# Patient Record
Sex: Male | Born: 1950 | Race: Black or African American | Hispanic: No | Marital: Married | Smoking: Never smoker
Health system: Southern US, Community
[De-identification: ages and names within clinical notes are randomized; demographics above are authoritative.]

## PROBLEM LIST (undated history)

## (undated) DIAGNOSIS — Z8614 Personal history of Methicillin resistant Staphylococcus aureus infection: Secondary | ICD-10-CM

## (undated) DIAGNOSIS — I10 Essential (primary) hypertension: Secondary | ICD-10-CM

## (undated) DIAGNOSIS — Z9889 Other specified postprocedural states: Secondary | ICD-10-CM

## (undated) DIAGNOSIS — I499 Cardiac arrhythmia, unspecified: Secondary | ICD-10-CM

## (undated) DIAGNOSIS — K429 Umbilical hernia without obstruction or gangrene: Secondary | ICD-10-CM

## (undated) DIAGNOSIS — M1990 Unspecified osteoarthritis, unspecified site: Secondary | ICD-10-CM

## (undated) DIAGNOSIS — Z8719 Personal history of other diseases of the digestive system: Secondary | ICD-10-CM

## (undated) HISTORY — DX: Essential (primary) hypertension: I10

## (undated) HISTORY — DX: Other specified postprocedural states: Z98.890

## (undated) HISTORY — PX: HERNIA REPAIR: SHX51

## (undated) HISTORY — PX: COLONOSCOPY: SHX174

---

## 1999-08-31 ENCOUNTER — Inpatient Hospital Stay (HOSPITAL_COMMUNITY): Admission: RE | Admit: 1999-08-31 | Discharge: 1999-09-03 | Payer: Self-pay | Admitting: Gastroenterology

## 1999-11-06 ENCOUNTER — Ambulatory Visit (HOSPITAL_COMMUNITY): Admission: RE | Admit: 1999-11-06 | Discharge: 1999-11-06 | Payer: Self-pay | Admitting: Gastroenterology

## 2001-02-04 ENCOUNTER — Inpatient Hospital Stay (HOSPITAL_COMMUNITY): Admission: EM | Admit: 2001-02-04 | Discharge: 2001-02-07 | Payer: Self-pay | Admitting: Orthopedic Surgery

## 2005-01-01 ENCOUNTER — Encounter (INDEPENDENT_AMBULATORY_CARE_PROVIDER_SITE_OTHER): Payer: Self-pay | Admitting: Specialist

## 2005-01-01 ENCOUNTER — Inpatient Hospital Stay (HOSPITAL_COMMUNITY): Admission: AD | Admit: 2005-01-01 | Discharge: 2005-01-05 | Payer: Self-pay | Admitting: Orthopedic Surgery

## 2005-09-03 HISTORY — PX: FOOT SURGERY: SHX648

## 2007-04-07 ENCOUNTER — Emergency Department (HOSPITAL_COMMUNITY): Admission: EM | Admit: 2007-04-07 | Discharge: 2007-04-07 | Payer: Self-pay | Admitting: Emergency Medicine

## 2007-04-07 HISTORY — PX: AORTIC ROOT REPLACEMENT: SHX1178

## 2007-09-12 ENCOUNTER — Ambulatory Visit (HOSPITAL_BASED_OUTPATIENT_CLINIC_OR_DEPARTMENT_OTHER): Admission: RE | Admit: 2007-09-12 | Discharge: 2007-09-12 | Payer: Self-pay | Admitting: Orthopedic Surgery

## 2011-03-05 ENCOUNTER — Other Ambulatory Visit: Payer: Self-pay | Admitting: Gastroenterology

## 2011-03-13 NOTE — Op Note (Signed)
NAMEELBERT, SPICKLER               ACCOUNT NO.:  [id number]   MEDICAL RECORD NO.:  [id number]          PATIENT TYPE:  AMB   LOCATION:  DSC                          FACILITY:  [facility]   PHYSICIAN:  Katy Fitch. Sypher, M.D. DATE OF BIRTH:  1950-12-15   DATE OF PROCEDURE:  09/12/2007  DATE OF DISCHARGE:                               OPERATIVE REPORT   PREOPERATIVE DIAGNOSIS:  1. 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  2. 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   POSTOPERATIVE DIAGNOSIS:  1. 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  2. 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   OPERATION:  1. Release of right transverse carpal ligament.  2. Through a separate incision, decompression of right ulnar nerve at      cubital tunnel with resection of the anconeus epitrochlear muscle.   SURGEON:  Katy Fitch. Sypher, M.D.   ASSISTANT:  Marveen Reeks. Dasnoit, P.A.-C.   ANESTHESIA:  General by LMA.   SUPERVISING ANESTHESIOLOGIST:  Janetta Hora. Gelene Mink, M.D.   INDICATIONS:  Remon Quinto is a 60 year old man referred by Dr. 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  Preoperative examination  suggested weakness of his ulnar innervated intrinsics and loss of  sensibility in the median and ulnar nerves.  Our concern was that he may  have had a brachial plexus stretch injury with the sternal splint for  his aortic reconstruction.  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  After informed consent, Mr. Sole is  brought to the operating room at this time.   PROCEDURE:  Raye Slyter is brought to the operating room and placed in  the supine position upon the operating  table.  Following the induction  of general anesthesia by LMA technique, the right arm was prepped with  Betadine soap solution and sterilely draped.  Following exsanguination  of the right arm with an Esmarch bandage, an arterial tourniquet on the  proximal brachium was inflated to 220 mmHg.   The procedure commenced with a short incision in the line of the ring  finger and the palm.  The subcutaneous tissues were carefully divided to  the palmar fascia.  This was split longitudinally to the common sensory  branch of median nerve.  These were followed back to the transverse  carpal ligament which was gently isolated from the median nerve with a  Insurance risk surveyor.  The transverse carpal ligament was released along  its ulnar border extending into the distal forearm.  This widely opened  the carpal canal.  No masses or other predicaments are noted.  Bleeding  points along margin of the loose ligament were electrocauterized with  bipolar current followed by repair of the skin with intradermal 3-0  Prolene suture.   Attention was then directed to the medial elbow.  A short incision was  fashioned directly over  the path of the ulnar nerve.  The subcutaneous  tissues were carefully divided revealing a rather sizable posterior  branch of the medial and brachiocutaneous nerve.  This was gently  dissected and protected.  The ulnar nerve was identified by palpation  posterior to the epicondyle.  There was an anconeus epitrochlear muscle  completely covering the nerve at the epicondyle.  The muscle was  released from the medial epicondyle and the fascia at the heads of the  flexor carpi ulnaris released over a distance of 6 cm.  The brachial  fascia was released proximally 6 cm.  The anconeus epitrochlear muscle  was released and fibrous bands deep to the flexor carpi ulnaris  released.  The nerve was inspected through a range of motion of 0 to 140  degrees elbow flexion.  The nerve was stable  in the cubital groove.  There was no tendency towards subluxation.   Bleeding points were electrocauterized with bipolar current followed by  repair of the skin with subdermal suture of 4-0 Vicryl and intradermal 3-  0 Prolene.  There were no apparent complications.  Mr. Thurman tolerated  the surgery and anesthesia well.  He was transferred to the recovery  room with stable vital signs.  A Tegaderm dressings applied to the elbow  with sterile gauze followed by an Ace wrap.  The hand was protected with  a sterile gauze dressing with a volar plaster splint maintaining the  wrist in 5 degrees of dorsiflexion.      Katy Fitch Sypher, M.D.  Electronically Signed     RVS/MEDQ  D:  09/12/2007  T:  09/13/2007  Job:  [id number]   cc:   Georgann Housekeeper, MD

## 2011-03-16 NOTE — Op Note (Signed)
Michael Moore, Michael Moore NO.:  [id number]   MEDICAL RECORD NO.:  [id number]          PATIENT TYPE:  OIB   LOCATION:  2866                         FACILITY:  [facility]   PHYSICIAN:  Nadara Mustard, MD     DATE OF BIRTH:  1951/08/02   DATE OF PROCEDURE:  01/01/2005  DATE OF DISCHARGE:                                 OPERATIVE REPORT   PREOPERATIVE DIAGNOSIS:  Abscess left foot with ischemic changes of the  fourth toe.   POSTOPERATIVE DIAGNOSIS:  Abscess left foot with ischemic changes of the  fourth toe.   PROCEDURE:  1.  Irrigation debridement, left foot.  2.  Fourth ray amputation left foot.   SURGEON.:  Nadara Mustard, MD   ANESTHESIA:  General.   ESTIMATED BLOOD LOSS:  Minimal.   ANTIBIOTICS:  1 gram of Kefzol.   TOURNIQUET TIME:  None.   CULTURES OBTAINED:  Times 2.   DISPOSITION:  To PACU in stable condition. The toe metatarsal sent to  pathology.   INDICATIONS FOR PROCEDURE:  The patient is a 60 year old gentleman with a  one-week history of a cellulitis and abscess of his left foot. Patient has  failed conservative care and presents at this time for urgent surgical  intervention. The risks and benefits were discussed including infection,  neurovascular injury, nonhealing of the wound, persistent infection, need  for further amputation. The patient states he understands and wish to  proceed at this time.   DESCRIPTION OF PROCEDURE:  The patient was brought to OR room 4 and  underwent general anesthetic. After adequate level of anesthesia obtained,  the patient's left lower extremity was prepped using DuraPrep and draped  into a sterile field. A dorsal incision was made over the fourth ray. A  large purulent abscess was encountered. Aerobic and anaerobic cultures were  obtained. The wound was debrided. The wound edges were debrided back to  viable healthy wound edges. The patient had a complete necrosis of all the  tissue surrounding the fourth toe  and the fourth toe and metatarsal were  amputated in one block of a ray. The wound was irrigated with pulse lavage x  3 liters. Hemostasis was obtained with electrocautery. The patient had good  bleeding wound edges. No evidence of any purulent or necrotic tissue left  within the wound. A one-quarter inch a Penrose drain was placed deep to the  wound and the plantar and dorsal wounds were closed without any tension on  the skin with a far-near-near-far suture with  3-0 nylon. The wound was covered Adaptic orthopedic sponges, sterile Webril  and a Coban dressing. The patient was extubated, taken to PACU in stable  condition, plan for admission, IV vancomycin and then discharge to home once  the cultures were finalized.      MVD/MEDQ  D:  01/01/2005  T:  01/02/2005  Job:  [id number]

## 2011-03-16 NOTE — H&P (Signed)
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 The patient states that his foot rubbed the  top of the shoe, developed a blister, and was seen by his primary care  physician and started on Keflex.  The patient is seen today in referral with  cellulitis of the entire dorsum of the left foot. He failed conservative  treatment with p.o. antibiotics.   ALLERGIES:  No known drug allergies.   MEDICATIONS:  Keflex 500 mg p.o. t.i.d.   PAST MEDICAL HISTORY:  The patient is healthy. He is status post inguinal  herniorrhaphy approximately 20 years ago.   SOCIAL HISTORY:  He works as a Museum/gallery curator.   FAMILY HISTORY:  Noncontributory.   REVIEW OF SYSTEMS:  Negative.   PHYSICAL EXAMINATION:  VITAL SIGNS: Temperature 98.3, pulse 80, respiratory  rate 16, blood pressure 132/90.  GENERAL: He is in no acute distress.  LUNGS: Clear to auscultation.  CARDIOVASCULAR: Regular rate and rhythm.  NECK: Supple with no bruits.  EXTREMITIES: Examination of the left lower extremity reveals cellulitis of  the entire dorsum of the left foot. He has ischemic changes of the 4th toe  with a draining purulent abscess from the 4th toe. He does have a palpable  dorsalis pulse. Radiographs show no bony abnormalities.   ASSESSMENT:  Left foot abscess cellulitis with ischemic changes of the 4th  toe.   PLAN:  The patient is scheduled for irrigation and debridement of the left  foot at this time. The risks and benefits were  discussed including  persistent infection, neurovascular injury, nonhealing of the wound,  potential need for amputation of the 4th toe and metatarsal. The patient  states he understands and wishes to proceed at this time.      MVD/MEDQ  D:  01/01/2005  T:  01/01/2005  Job:  [id number]

## 2011-03-16 NOTE — Discharge Summary (Signed)
Michael Moore, Michael Moore NO.:  [id number]   MEDICAL RECORD NO.:  [id number]          PATIENT TYPE:  INP   LOCATION:  5005                         FACILITY:  [facility]   PHYSICIAN:  Nadara Mustard, MD     DATE OF BIRTH:  1951/01/19   DATE OF ADMISSION:  01/01/2005  DATE OF DISCHARGE:  01/05/2005                                 DISCHARGE SUMMARY   DIAGNOSIS:  Abscess, left foot.   PROCEDURE:  Irrigation debridement of  left foot with a fourth ray  amputation.   CONDITION:  The patient was discharged to home in stable condition.   FOLLOWUP:  Follow-up in the office in two weeks.   DISCHARGE MEDICATIONS:  Prescription for Levaquin and Vicodin was given.   HISTORY OF PRESENT ILLNESS:  The patient is a 60 year old gentleman with  abscess of the left foot, including a fourth ray. The patient has failed  conservative care with p.o. antibiotics and wound debridement, and pressure  unloading.  Presents this time for a fourth ray amputation. The patient  underwent fourth ray amputation on January 01, 2005.  He received Kefzol for  infection prophylaxis. Cultures were obtained x2. Postoperatively, cultures  were positive for gram-positive cocci, and the patient was kept on Kefzol  until the final cultures results were available. The patient received  physical therapy with touchdown weightbearing on the left.   On January 04, 2005 cultures were positive for Staphylococcus aureus; however,  sensitivities were pending. The patient was changed to vancomycin after the  cultures were positive. The lab was contacted and cultures were positive for  MRSA. Cultures were sensitive to Levaquin, and the patient was switched from  IV vancomycin to p.o. Levaquin.   DISPOSITION:  Discharged to home in stable condition on January 05, 2005 with  follow-up in the office in two weeks.      MVD/MEDQ  D:  03/07/2005  T:  03/07/2005  Job:  [id number]

## 2011-03-16 NOTE — H&P (Signed)
[facility]  Patient:    Michael Moore, Michael Moore                        MRN: [id number] Adm. Date:  02/04/01 Attending:  Georges Lynch. Darrelyn Hillock, M.D. Dictator:   Della Goo, P.A.                         History and Physical  CHIEF COMPLAINT:  Right knee pain and swelling.  HISTORY OF PRESENT ILLNESS:  Michael Moore is a 60 year old black male who presented to Dr. Joellyn Moore office today with 3-4 days of right knee pain and swelling. He noted swelling over the weekend which has increased over the past few days. He does not recall having an injury to the knee, however, feels that he may have bumped it 2-3 days weeks ago. He had no abrasions or cuts to the knee. He has had a low grade fever and over the past couple of days has had significant swelling causing him to limp. At this point he is in considerable discomfort. In the office he was noted to have erythema and edema about the right knee in the prepatellar area. Dr. Darrelyn Hillock performed aspiration of this area in the office and obtained slightly cloudy aspirate which was sent for culture and sensitivity. Due to his ongoing symptoms and findings from aspirate, it was felt that he would require IV antibiotic therapy and is being admitted to the hospital for IV therapy as well as bed rest and pain control. It is not note that patient denies chills.  ALLERGIES:  No known drug allergies.  CURRENT MEDICATIONS:  None.  PAST MEDICAL HISTORY:  The patient has been healthy with no chronic illnesses.   PAST SURGICAL HISTORY:  Bilateral hernia procedure in 1970s.  FAMILY HISTORY:  The patients mother is alive at age 29 and healthy. His father died in the [employer].  REVIEW OF SYSTEMS:  CNS:  Patient denies blurred vision, double vision, seizure disorder, headaches or paralysis.  CARDIORESPIRATORY:  No chest pain, shortness of breath, cough, sputum production or hemoptysis.  GU/GI:  No nausea, vomiting, diarrhea or  constipation. No dysuria, hematuria, melena or bloody stools. MUSCULOSKELETAL:  As per history of present illness.  PHYSICAL EXAMINATION:  VITAL SIGNS:  Pulse 76 and regular, blood pressure 120/80.  GENERAL:  He is a well-developed, well-nourished black male alert and oriented x 4 in in no acute distress.  HEENT:  Normocephalic and atraumatic. Pupils equal, round and reactive to light and accommodation. Extraocular movements intact. Nose without drainage. Oropharynx without edema or erythema.  NECK:  Supple, no adenopathy or thyromegaly.  LUNGS:  Clear to auscultation.  CARDIOVASCULAR:  Regular rate and rhythm, no murmur heard.  ABDOMEN:  Soft, nontender, bowel sounds present.  BREASTS:  Not performed, not pertinent to present illness.  GENITORECTAL:  Not performed, not pertinent to present illness.  EXTREMITIES:  The patient has erythema and edema about the prepatellar area of the right knee. It is fluctuant to palpation. Digitally, there is no cyanosis, clubbing, or edema. Distal pulses are intact and sensation is intact.  IMPRESSION:  Infected right prepatellar bursitis.  PLAN:  The patient is admitted to [facility] for IV Ancef. Aspirate was sent for culture and sensitivity and final antibiotic recommendations will be made according to the culture and sensitivity when available. He will be at bed rest with analgesics and a K pad to the knee.  DD:  02/04/01 TD:  02/04/01 Job: [id number]

## 2011-03-16 NOTE — Discharge Summary (Signed)
[facility]  Patient:    Michael Moore, Michael Moore                      MRN: [id number] Adm. Date:  [id number] Disc. Date: [id number] Attending:  Skip Mayer Dictator:   Della Goo, P.A.                           Discharge Summary  ADMITTING DIAGNOSIS:  Possible infected right prepatellar bursitis.  DISCHARGE DIAGNOSIS:  Inflamed prepatellar bursitis, right knee, with negative cultures from knee joint aspirate.  PROCEDURES:  None.  CONSULTATIONS:  None.  HISTORY OF PRESENT ILLNESS:  Patient is a 60 year old black male who presented to Dr. Joellyn Rued practice on the day of admission with three to four days history of right knee pain and swelling.  The pain increased over the past 24 hours with significant swelling causing him to have an obvious lymph.  In the office, an aspiration was done and a slightly cloudy aspirate was sent for culture and sensitivity.  Exam revealed him to have an inflamed reddened area over the knee.  It was felt he would require admission to the hospital for AV antibiotic therapy and bed rest with analgesics for pain control.  HOSPITAL COURSE:  Upon admission, he was placed at bed rest.  He was allowed bathroom privileges only.  K pad was applied to the right knee with elevation of the right lower extremity, as well.  He continued to have low-grade fevers during the hospital stay, but the erythema and edema did decrease significantly.  He was on IV Ancef upon admission and changed to oral Keflex prior to discharge.  his cultures were negative and, on February 07, 2001, patient was felt stable for discharge to his home.  PERTINENT LABORATORY VALUES:  CBC on admission normal.  Coagulation study on admission normal.  Chemistry study on admission with potassium 3.4, otherwise normal.  Urinalysis on admission was negative for a urinary tract infection.  PLAN:  Patient was discharged to his home with instructions to  continue modifying his activity to include elevating the right leg when resting.  He was allowed weightbearing as tolerated on the right lower extremity.  Patient will continue on a regular diet.  He will follow up with Dr. Darrelyn Hillock in one week and has been advised to call to make the appointment.  If he has elevated fevers, recurrent edema or erythema about the knee, or other concerns, he has been advised to call prior to that time.  Prescriptions given at discharge include Keflex 500 mg one p.o. q.i.d.,  #28, Vicodin one to two every 4 to 6 hours as needed for pain, #30, no refills.  CONDITION ON DISCHARGE:  Stable. DD:  03/05/01 TD:  03/06/01 Job: [id number]

## 2011-08-07 LAB — BASIC METABOLIC PANEL
BUN: 20
CO2: 29
Chloride: 99
Glucose, Bld: 107 — ABNORMAL HIGH
Potassium: 4.4
Sodium: 137

## 2011-08-16 LAB — DIFFERENTIAL
Basophils Relative: 0
Eosinophils Absolute: 0.1
Lymphs Abs: 1.8
Monocytes Absolute: 1.2 — ABNORMAL HIGH
Monocytes Relative: 8
Neutrophils Relative %: 80 — ABNORMAL HIGH

## 2011-08-16 LAB — I-STAT 8, (EC8 V) (CONVERTED LAB)
Acid-Base Excess: 1
BUN: 15
Bicarbonate: 28 — ABNORMAL HIGH
Chloride: 105
HCT: 50
Hemoglobin: 17
Operator id: 272551
Sodium: 138
pCO2, Ven: 52.7 — ABNORMAL HIGH

## 2011-08-16 LAB — CBC
Hemoglobin: 15.6
MCHC: 33.5
MCV: 91.1
RBC: 5.13
WBC: 15.4 — ABNORMAL HIGH

## 2011-08-16 LAB — POCT I-STAT CREATININE: Creatinine, Ser: 1.1

## 2011-08-16 LAB — POCT CARDIAC MARKERS
Myoglobin, poc: 148
Operator id: 272551
Troponin i, poc: <0.05

## 2014-02-23 ENCOUNTER — Ambulatory Visit: Payer: Self-pay | Admitting: Cardiovascular Disease

## 2014-03-04 ENCOUNTER — Telehealth: Payer: Self-pay | Admitting: Cardiovascular Disease

## 2014-03-05 NOTE — Telephone Encounter (Signed)
Closed encounter °

## 2014-03-31 ENCOUNTER — Ambulatory Visit: Payer: Self-pay | Admitting: Cardiovascular Disease

## 2014-04-13 ENCOUNTER — Encounter: Payer: Self-pay | Admitting: Cardiovascular Disease

## 2014-04-13 ENCOUNTER — Ambulatory Visit (INDEPENDENT_AMBULATORY_CARE_PROVIDER_SITE_OTHER): Payer: BC Managed Care – PPO | Admitting: Cardiovascular Disease

## 2014-04-13 VITALS — BP 143/70 | HR 60 | Ht 72.0 in | Wt 246.0 lb

## 2014-04-13 DIAGNOSIS — Z01818 Encounter for other preprocedural examination: Secondary | ICD-10-CM

## 2014-04-13 DIAGNOSIS — Z952 Presence of prosthetic heart valve: Secondary | ICD-10-CM

## 2014-04-13 DIAGNOSIS — Z9889 Other specified postprocedural states: Secondary | ICD-10-CM

## 2014-04-13 DIAGNOSIS — Z954 Presence of other heart-valve replacement: Secondary | ICD-10-CM

## 2014-04-13 DIAGNOSIS — I10 Essential (primary) hypertension: Secondary | ICD-10-CM

## 2014-04-13 NOTE — Progress Notes (Signed)
04/13/2014 Michael Moore   May 04, 1951  [id number]  Primary Physician Wenda Low, MD Primary Cardiologist: Lorretta Harp MD Renae Gloss   HPI:  Mr. Michael Moore is a 63 year old moderately overweight married African American male father of one daughter scheduled to get married later this week. 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 He was referred by Dr. Gardiner Barefoot podiatrist for cardiovascular evaluation prior to elective toenail removal. His cardiovascular cytopathologist only remarkable for treated hypertension. 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 Clementeen Graham at [facility] in [place] in 2008. He is followed there on an annual basis by 2-D echocardiography. He had a MRSA infection of his left fourth toe remotely and this was surgically removed by Dr. Sharol Given with a ray  Resection. He has a fungal infection of his first toe on the left and Dr. Dwaine Gale was to remove this. He was referred here for peripheral vascular evaluation to determine healing potential and circulation. The patient denies claudication, chest pain or shortness of breath   Current Outpatient Prescriptions  Medication Sig Dispense Refill  . aspirin 81 MG tablet Take 81 mg by mouth daily.      Marland Kitchen losartan-hydrochlorothiazide (HYZAAR) 100-25 MG per tablet Take 1 tablet by mouth daily.       . metoprolol succinate (TOPROL-XL) 25 MG 24 hr tablet Take 25 mg by mouth daily.        No current facility-administered medications for this visit.    No Known Allergies  History   Social History  . Marital Status: Married    Spouse Name: N/A    Number of Children: N/A  . Years of Education: N/A   Occupational History  . Not on file.   Social History Main Topics  . Smoking status: Never Smoker   . Smokeless tobacco: Not on file  . Alcohol Use: Not on file  . Drug Use: Not on file    . Sexual Activity: Not on file   Other Topics Concern  . Not on file   Social History Narrative  . No narrative on file     Review of Systems: General: negative for chills, fever, night sweats or weight changes.  Cardiovascular: negative for chest pain, dyspnea on exertion, edema, orthopnea, palpitations, paroxysmal nocturnal dyspnea or shortness of breath Dermatological: negative for rash Respiratory: negative for cough or wheezing Urologic: negative for hematuria Abdominal: negative for nausea, vomiting, diarrhea, bright red blood per rectum, melena, or hematemesis Neurologic: negative for visual changes, syncope, or dizziness All other systems reviewed and are otherwise negative except as noted above.    Blood pressure 143/70, pulse 60, height 6' (1.829 m), weight 246 lb (111.585 kg).  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 Clementeen Graham at [facility] in [place] in 2008. He is asymptomatic and otherwise follow up on an annual basis by 2-D echocardiograms  Essential hypertension Controlled on current medications  Status post left foot surgery Patient had MRSA  infection of left fourth toe underwent a ray resection by Dr. Sharol Given remotely.he wants to have his toenails removed by his podiatrist. He was sent here for peripheral vascular evaluation. He does have 1+ dorsalis pedis and 2+ posterior tibial pulses on that side. I am going to get lower extremity arterial Dopplers  to further evaluate his circulation prior to clearing him for his upcoming podiatry procedure.      Lorretta Harp MD FACP,FACC,FAHA, [facility] 04/13/2014 4:12 PM

## 2014-04-13 NOTE — Assessment & Plan Note (Signed)
Patient had MRSA infection of left fourth toe underwent a ray resection by Dr. Sharol Given remotely.he wants to have his toenails removed by his podiatrist. He was sent here for peripheral vascular evaluation. He does have 1+ dorsalis pedis and 2+ posterior tibial pulses on that side. I am going to get lower extremity arterial Dopplers to further evaluate his circulation prior to clearing him for his upcoming podiatry procedure.

## 2014-04-13 NOTE — Patient Instructions (Signed)
  We will see you back in follow up as needed.   Dr Gwenlyn Found has ordered lower extremity arterial doppler- During this test, ultrasound is used to evaluate arterial blood flow in the legs. Allow approximately one hour for this exam.

## 2014-04-13 NOTE — Assessment & Plan Note (Signed)
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 Clementeen Graham at [facility] in [place] in 2008. He is asymptomatic and otherwise follow up on an annual basis by 2-D echocardiograms

## 2014-04-13 NOTE — Assessment & Plan Note (Signed)
Controlled on current medications 

## 2014-04-21 ENCOUNTER — Encounter (HOSPITAL_COMMUNITY): Payer: BC Managed Care – PPO

## 2014-08-09 ENCOUNTER — Other Ambulatory Visit (HOSPITAL_COMMUNITY): Payer: Self-pay | Admitting: Orthopedic Surgery

## 2014-08-10 ENCOUNTER — Encounter (HOSPITAL_COMMUNITY): Payer: Self-pay | Admitting: Pharmacy Technician

## 2014-08-11 ENCOUNTER — Encounter (HOSPITAL_COMMUNITY)
Admission: RE | Admit: 2014-08-11 | Discharge: 2014-08-11 | Disposition: A | Discharge status: Home / Self Care | Payer: Federal, State, Local not specified - PPO | Source: Ambulatory Visit | Attending: Orthopedic Surgery | Admitting: Orthopedic Surgery

## 2014-08-11 ENCOUNTER — Other Ambulatory Visit (HOSPITAL_COMMUNITY): Payer: Self-pay | Admitting: *Deleted

## 2014-08-11 ENCOUNTER — Encounter (HOSPITAL_COMMUNITY): Payer: Self-pay

## 2014-08-11 ENCOUNTER — Ambulatory Visit (HOSPITAL_COMMUNITY)
Admission: RE | Admit: 2014-08-11 | Discharge: 2014-08-11 | Disposition: A | Discharge status: Home / Self Care | Payer: Federal, State, Local not specified - PPO | Source: Ambulatory Visit | Attending: Orthopedic Surgery | Admitting: Orthopedic Surgery

## 2014-08-11 DIAGNOSIS — I517 Cardiomegaly: Secondary | ICD-10-CM | POA: Diagnosis not present

## 2014-08-11 DIAGNOSIS — Z951 Presence of aortocoronary bypass graft: Secondary | ICD-10-CM | POA: Insufficient documentation

## 2014-08-11 DIAGNOSIS — M1611 Unilateral primary osteoarthritis, right hip: Secondary | ICD-10-CM | POA: Diagnosis not present

## 2014-08-11 DIAGNOSIS — Z01812 Encounter for preprocedural laboratory examination: Secondary | ICD-10-CM | POA: Insufficient documentation

## 2014-08-11 DIAGNOSIS — J9811 Atelectasis: Secondary | ICD-10-CM | POA: Insufficient documentation

## 2014-08-11 DIAGNOSIS — Z01811 Encounter for preprocedural respiratory examination: Secondary | ICD-10-CM | POA: Diagnosis present

## 2014-08-11 HISTORY — DX: Cardiac arrhythmia, unspecified: I49.9

## 2014-08-11 HISTORY — DX: Umbilical hernia without obstruction or gangrene: K42.9

## 2014-08-11 HISTORY — DX: Unspecified osteoarthritis, unspecified site: M19.90

## 2014-08-11 LAB — COMPREHENSIVE METABOLIC PANEL
ALT: 18 U/L (ref 0–53)
AST: 35 U/L (ref 0–37)
Albumin: 3.7 g/dL (ref 3.5–5.2)
Alkaline Phosphatase: 61 U/L (ref 39–117)
Anion gap: 15 (ref 5–15)
BUN: 24 mg/dL — ABNORMAL HIGH (ref 6–23)
CALCIUM: 9.5 mg/dL (ref 8.4–10.5)
CO2: 23 meq/L (ref 19–32)
CREATININE: 1.19 mg/dL (ref 0.50–1.35)
Chloride: 104 mEq/L (ref 96–112)
GFR calc non Af Amer: 64 mL/min — ABNORMAL LOW (ref >90)
GFR, EST AFRICAN AMERICAN: 74 mL/min — AB (ref >90)
GLUCOSE: 95 mg/dL (ref 70–99)
Potassium: 4 mEq/L (ref 3.7–5.3)
SODIUM: 142 meq/L (ref 137–147)
TOTAL PROTEIN: 7.4 g/dL (ref 6.0–8.3)
Total Bilirubin: 0.8 mg/dL (ref 0.3–1.2)

## 2014-08-11 LAB — CBC
HCT: 43.7 % (ref 39.0–52.0)
HEMOGLOBIN: 15.3 g/dL (ref 13.0–17.0)
MCH: 31 pg (ref 26.0–34.0)
MCHC: 35 g/dL (ref 30.0–36.0)
MCV: 88.5 fL (ref 78.0–100.0)
Platelets: 195 10*3/uL (ref 150–400)
RBC: 4.94 MIL/uL (ref 4.22–5.81)
RDW: 13 % (ref 11.5–15.5)
WBC: 6.6 10*3/uL (ref 4.0–10.5)

## 2014-08-11 LAB — PROTIME-INR
INR: 1.02 (ref 0.00–1.49)
Prothrombin Time: 13.6 seconds (ref 11.6–15.2)

## 2014-08-11 LAB — APTT: aPTT: 30 seconds (ref 24–37)

## 2014-08-11 LAB — SURGICAL PCR SCREEN
MRSA, PCR: NEGATIVE
STAPHYLOCOCCUS AUREUS: NEGATIVE

## 2014-08-11 NOTE — Pre-Procedure Instructions (Signed)
Michael Moore  08/11/2014   Your procedure is scheduled on:  Wednesday, August 25, 2014 at 8:30 AM.   Report to [facility] Entrance "A" Admitting Office at 6:30 AM.   Call this number if you have problems the morning of surgery: [phone]   Remember:   Do not eat food or drink liquids after midnight Tuesday, 08/24/14.   Take these medicines the morning of surgery with A SIP OF WATER: metoprolol succinate (TOPROL-XL)  Stop Vitamins and Aspirin 7 days prior to surgery. Do not take any NSAIDS (Ibuprofen, Aleve, etc) 7 days prior to surgery.    Do not wear jewelry.  Do not wear lotions, powders, or cologne. You may wear deodorant.  Men may shave face and neck.  Do not bring valuables to the hospital.  [facility] is not responsible                  for any belongings or valuables.               Contacts, dentures or bridgework may not be worn into surgery.  Leave suitcase in the car. After surgery it may be brought to your room.  For patients admitted to the hospital, discharge time is determined by your                treatment team.               Special Instructions: [place] - Preparing for Surgery  Before surgery, you can play an important role.  Because skin is not sterile, your skin needs to be as free of germs as possible.  You can reduce the number of germs on you skin by washing with CHG (chlorahexidine gluconate) soap before surgery.  CHG is an antiseptic cleaner which kills germs and bonds with the skin to continue killing germs even after washing.  Please DO NOT use if you have an allergy to CHG or antibacterial soaps.  If your skin becomes reddened/irritated stop using the CHG and inform your nurse when you arrive at Short Stay.  Do not shave (including legs and underarms) for at least 48 hours prior to the first CHG shower.  You may shave your face.  Please follow these instructions carefully:   1.  Shower with CHG Soap the night before surgery and  the                                morning of Surgery.  2.  If you choose to wash your hair, wash your hair first as usual with your       normal shampoo.  3.  After you shampoo, rinse your hair and body thoroughly to remove the                      Shampoo.  4.  Use CHG as you would any other liquid soap.  You can apply chg directly       to the skin and wash gently with scrungie or a clean washcloth.  5.  Apply the CHG Soap to your body ONLY FROM THE NECK DOWN.        Do not use on open wounds or open sores.  Avoid contact with your eyes, ears, mouth and genitals (private parts).  Wash genitals (private parts) with your normal soap.  6.  Wash thoroughly, paying special attention to the  area where your surgery        will be performed.  7.  Thoroughly rinse your body with warm water from the neck down.  8.  DO NOT shower/wash with your normal soap after using and rinsing off       the CHG Soap.  9.  Pat yourself dry with a clean towel.            10.  Wear clean pajamas.            11.  Place clean sheets on your bed the night of your first shower and do not        sleep with pets.  Day of Surgery  Do not apply any lotions the morning of surgery.  Please wear clean clothes to the hospital.     Please read over the following fact sheets that you were given: Pain Booklet, Coughing and Deep Breathing, MRSA Information and Surgical Site Infection Prevention

## 2014-08-11 NOTE — Progress Notes (Signed)
Pt's PCP is Dr. Wenda Low - Stop [place] Lighter assessment sent to Dr. Lysle Rubens  Pt's cardiologist is at [facility]. Had Echo done 07/09/14 (in [facility] thru outside charts) Pt denies any chest pain or sob. Was told by cardiologist that he can be seen every 2 years now.

## 2014-08-11 NOTE — Progress Notes (Signed)
08/11/14 1540  OBSTRUCTIVE SLEEP APNEA  Have you ever been diagnosed with sleep apnea through a sleep study? No  Do you snore loudly (loud enough to be heard through closed doors)?  0  Do you often feel tired, fatigued, or sleepy during the daytime? 0  Has anyone observed you stop breathing during your sleep? 0  Do you have, or are you being treated for high blood pressure? 1  BMI more than 35 kg/m2? 0  Age over 62 years old? 1  Neck circumference greater than 40 cm/16 inches? 1  Gender: 1  Obstructive Sleep Apnea Score 4  Score 4 or greater  Results sent to PCP

## 2014-08-12 NOTE — Progress Notes (Signed)
Anesthesia Chart Review:  Patient is a 63 year old male scheduled for right THA on 08/25/14 by Dr. Sharol Given.  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 BMI is consistent with mild obesity. OSA screening score is 4. PCP is Dr. Wenda Low.  Cardiologist is Dr. Gaspar Skeeters Roopa Aggarwal/Dr. Mercie Eon [facility]), last visit 07/09/14 with one year follow-up and echo in 2 years recommended. He has also seen local cardiologist Dr. Gwenlyn Found in 03/2014, but this was for PVD.  He ordered LE arterial doppler studies, but it doesn't appear that they were ever done.  Meds: MVI, ASA 81mg , Citracal with D, losartan-HCTZ, Toprol.   PAT Vitals: 135/53, HR 65, RR 20, 98% RA, T 37.3 C.  EKG on 08/11/14 showed: SB at 59 bpm with fusion complexes, possible LAE, non-specific T wave abnormality.  07/09/14 echo (Care Everywhere): The left ventricular size is normal. There is normal left ventricular wall thickness. Left ventricular systolic function is normal. The right ventricle is not well visualized; probably overall normal size and function. The left atrial size is normal. Right atrial size is normal. There is a bioprosthetic aortic valve. 27 mm Medtronic Freestyle Stentless Porcine Bioprosthesis. Leaflet morphology and opening appear normal. Mean gradient <10. No aortic regurgitation. There is no pericardial effusion. Probably no signficant change from prior exam of 04/16/14.   CXR on 08/11/14 showed: Bibasilar linear atelectasis or scarring. No definite active process. Stable mild cardiomegaly. Median sternotomy sutures are noted.  Preoperative labs noted.    Patient with recent cardiology follow-up with stable echo.  If  no acute changes then I anticipate that he can proceed as planned.  Anesthesiologist Dr. Glennon Mac agrees with plan.  Further evaluation by his assigned anesthesiologist on the day of surgery.  [facility] Short Stay Center/Anesthesiology Phone [phone] 08/12/2014 12:35 PM

## 2014-08-24 MED ORDER — CEFAZOLIN SODIUM-DEXTROSE 2-3 GM-% IV SOLR
2.0000 g | INTRAVENOUS | Status: AC
  Administered 2014-08-25: 2 g via INTRAVENOUS
  Filled 2014-08-24: qty 50

## 2014-08-24 NOTE — Anesthesia Preprocedure Evaluation (Addendum)
Anesthesia Evaluation  Patient identified by MRN, date of birth, ID band Patient awake    Reviewed: Allergy & Precautions, H&P , NPO status , Patient's Chart, lab work & pertinent test results, reviewed documented beta blocker date and time   Airway Mallampati: II  TM Distance: >3 FB Neck ROM: Full  Mouth opening: Limited Mouth Opening  Dental  (+) Dental Advisory Given, Teeth Intact   Pulmonary  breath sounds clear to auscultation        Cardiovascular hypertension, Pt. on medications and Pt. on home beta blockers + Valvular Problems/Murmurs (SP AVRn replacement) Rhythm:Regular     Neuro/Psych    GI/Hepatic   Endo/Other    Renal/GU      Musculoskeletal   Abdominal (+)  Abdomen: soft.    Peds  Hematology   Anesthesia Other Findings   Reproductive/Obstetrics                           Anesthesia Physical Anesthesia Plan  ASA: III  Anesthesia Plan: General   Post-op Pain Management:    Induction: Intravenous  Airway Management Planned: Oral ETT  Additional Equipment:   Intra-op Plan:   Post-operative Plan: Extubation in OR  Informed Consent: I have reviewed the patients History and Physical, chart, labs and discussed the procedure including the risks, benefits and alternatives for the proposed anesthesia with the patient or authorized representative who has indicated his/her understanding and acceptance.   Dental advisory given  Plan Discussed with: Anesthesiologist and Surgeon  Anesthesia Plan Comments: (Tylenol, Decadron, Precedex)       Anesthesia Quick Evaluation

## 2014-08-25 ENCOUNTER — Encounter (HOSPITAL_COMMUNITY): Payer: Federal, State, Local not specified - PPO | Admitting: Vascular Surgery

## 2014-08-25 ENCOUNTER — Encounter (HOSPITAL_COMMUNITY)
Admission: RE | Disposition: A | Discharge status: Home Health | Payer: Self-pay | Source: Ambulatory Visit | Attending: Orthopedic Surgery

## 2014-08-25 ENCOUNTER — Inpatient Hospital Stay (HOSPITAL_COMMUNITY): Payer: Federal, State, Local not specified - PPO | Admitting: Certified Registered"

## 2014-08-25 ENCOUNTER — Encounter (HOSPITAL_COMMUNITY): Payer: Self-pay | Admitting: *Deleted

## 2014-08-25 ENCOUNTER — Inpatient Hospital Stay (HOSPITAL_COMMUNITY)
Admission: RE | Admit: 2014-08-25 | Discharge: 2014-08-27 | DRG: 470 | Disposition: A | Discharge status: Home Health | Payer: Federal, State, Local not specified - PPO | Source: Ambulatory Visit | Attending: Orthopedic Surgery | Admitting: Orthopedic Surgery

## 2014-08-25 DIAGNOSIS — Z952 Presence of prosthetic heart valve: Secondary | ICD-10-CM | POA: Diagnosis not present

## 2014-08-25 DIAGNOSIS — Z8249 Family history of ischemic heart disease and other diseases of the circulatory system: Secondary | ICD-10-CM

## 2014-08-25 DIAGNOSIS — Z79899 Other long term (current) drug therapy: Secondary | ICD-10-CM

## 2014-08-25 DIAGNOSIS — M25551 Pain in right hip: Secondary | ICD-10-CM | POA: Diagnosis present

## 2014-08-25 DIAGNOSIS — M1611 Unilateral primary osteoarthritis, right hip: Secondary | ICD-10-CM | POA: Diagnosis present

## 2014-08-25 DIAGNOSIS — Z823 Family history of stroke: Secondary | ICD-10-CM

## 2014-08-25 DIAGNOSIS — I10 Essential (primary) hypertension: Secondary | ICD-10-CM | POA: Diagnosis present

## 2014-08-25 DIAGNOSIS — Z7982 Long term (current) use of aspirin: Secondary | ICD-10-CM

## 2014-08-25 DIAGNOSIS — Z96649 Presence of unspecified artificial hip joint: Secondary | ICD-10-CM

## 2014-08-25 HISTORY — PX: TOTAL HIP ARTHROPLASTY: SHX124

## 2014-08-25 LAB — GLUCOSE, CAPILLARY: Glucose-Capillary: 158 mg/dL — ABNORMAL HIGH (ref 70–99)

## 2014-08-25 SURGERY — ARTHROPLASTY, HIP, TOTAL,POSTERIOR APPROACH
Anesthesia: General | Site: Hip | Laterality: Right

## 2014-08-25 MED ORDER — PHENYLEPHRINE HCL 10 MG/ML IJ SOLN
INTRAMUSCULAR | Status: DC | PRN
  Administered 2014-08-25: 80 ug via INTRAVENOUS

## 2014-08-25 MED ORDER — METHOCARBAMOL 500 MG PO TABS
500.0000 mg | ORAL_TABLET | Freq: Four times a day (QID) | ORAL | Status: DC | PRN
  Administered 2014-08-26 – 2014-08-27 (×2): 500 mg via ORAL
  Filled 2014-08-25 (×2): qty 1

## 2014-08-25 MED ORDER — METOCLOPRAMIDE HCL 5 MG/ML IJ SOLN: 5.0000 mg | Freq: Three times a day (TID) | INTRAMUSCULAR | Status: DC | PRN

## 2014-08-25 MED ORDER — ACETAMINOPHEN 650 MG RE SUPP: 650.0000 mg | Freq: Four times a day (QID) | RECTAL | Status: DC | PRN

## 2014-08-25 MED ORDER — ACETAMINOPHEN 325 MG PO TABS: 650.0000 mg | ORAL_TABLET | Freq: Four times a day (QID) | ORAL | Status: DC | PRN

## 2014-08-25 MED ORDER — ROCURONIUM BROMIDE 50 MG/5ML IV SOLN
INTRAVENOUS | Status: AC
  Filled 2014-08-25: qty 1

## 2014-08-25 MED ORDER — MIDAZOLAM HCL 2 MG/2ML IJ SOLN
INTRAMUSCULAR | Status: AC
  Filled 2014-08-25: qty 2

## 2014-08-25 MED ORDER — PHENYLEPHRINE 40 MCG/ML (10ML) SYRINGE FOR IV PUSH (FOR BLOOD PRESSURE SUPPORT)
PREFILLED_SYRINGE | INTRAVENOUS | Status: AC
  Filled 2014-08-25: qty 10

## 2014-08-25 MED ORDER — ALUM & MAG HYDROXIDE-SIMETH 200-200-20 MG/5ML PO SUSP: 30.0000 mL | ORAL | Status: DC | PRN

## 2014-08-25 MED ORDER — PROPOFOL 10 MG/ML IV BOLUS
INTRAVENOUS | Status: AC
  Filled 2014-08-25: qty 20

## 2014-08-25 MED ORDER — MIDAZOLAM HCL 5 MG/5ML IJ SOLN
INTRAMUSCULAR | Status: DC | PRN
  Administered 2014-08-25: 2 mg via INTRAVENOUS

## 2014-08-25 MED ORDER — METOPROLOL SUCCINATE ER 25 MG PO TB24
25.0000 mg | ORAL_TABLET | Freq: Every day | ORAL | Status: DC
  Administered 2014-08-26 – 2014-08-27 (×2): 25 mg via ORAL
  Filled 2014-08-25 (×2): qty 1

## 2014-08-25 MED ORDER — SUCCINYLCHOLINE CHLORIDE 20 MG/ML IJ SOLN
INTRAMUSCULAR | Status: AC
  Filled 2014-08-25: qty 1

## 2014-08-25 MED ORDER — METOCLOPRAMIDE HCL 5 MG PO TABS
5.0000 mg | ORAL_TABLET | Freq: Three times a day (TID) | ORAL | Status: DC | PRN
  Filled 2014-08-25: qty 2

## 2014-08-25 MED ORDER — ONDANSETRON HCL 4 MG/2ML IJ SOLN: 4.0000 mg | Freq: Four times a day (QID) | INTRAMUSCULAR | Status: DC | PRN

## 2014-08-25 MED ORDER — DEXAMETHASONE SODIUM PHOSPHATE 4 MG/ML IJ SOLN
INTRAMUSCULAR | Status: DC | PRN
  Administered 2014-08-25: 8 mg via INTRAVENOUS

## 2014-08-25 MED ORDER — PHENOL 1.4 % MT LIQD: 1.0000 | OROMUCOSAL | Status: DC | PRN

## 2014-08-25 MED ORDER — GLYCOPYRROLATE 0.2 MG/ML IJ SOLN
INTRAMUSCULAR | Status: DC | PRN
  Administered 2014-08-25: 0.4 mg via INTRAVENOUS
  Administered 2014-08-25: 0.2 mg via INTRAVENOUS

## 2014-08-25 MED ORDER — SODIUM CHLORIDE 0.9 % IR SOLN
Status: DC | PRN
Start: 2014-08-25 — End: 2014-08-25
  Administered 2014-08-25: 1

## 2014-08-25 MED ORDER — ASPIRIN EC 325 MG PO TBEC
325.0000 mg | DELAYED_RELEASE_TABLET | Freq: Every day | ORAL | Status: DC
  Administered 2014-08-26 – 2014-08-27 (×2): 325 mg via ORAL
  Filled 2014-08-25 (×3): qty 1

## 2014-08-25 MED ORDER — SODIUM CHLORIDE 0.9 % IJ SOLN
INTRAMUSCULAR | Status: AC
Start: 2014-08-25 — End: 2014-08-25
  Filled 2014-08-25: qty 10

## 2014-08-25 MED ORDER — OXYCODONE HCL 5 MG PO TABS
5.0000 mg | ORAL_TABLET | ORAL | Status: DC | PRN
  Administered 2014-08-26 – 2014-08-27 (×2): 10 mg via ORAL
  Filled 2014-08-25 (×2): qty 2

## 2014-08-25 MED ORDER — EPHEDRINE SULFATE 50 MG/ML IJ SOLN
INTRAMUSCULAR | Status: AC
  Filled 2014-08-25: qty 1

## 2014-08-25 MED ORDER — METHOCARBAMOL 1000 MG/10ML IJ SOLN
500.0000 mg | Freq: Four times a day (QID) | INTRAMUSCULAR | Status: DC | PRN
  Filled 2014-08-25: qty 5

## 2014-08-25 MED ORDER — FENTANYL CITRATE 0.05 MG/ML IJ SOLN
INTRAMUSCULAR | Status: AC
  Filled 2014-08-25: qty 5

## 2014-08-25 MED ORDER — MEPERIDINE HCL 25 MG/ML IJ SOLN: 6.2500 mg | INTRAMUSCULAR | Status: DC | PRN

## 2014-08-25 MED ORDER — KETOROLAC TROMETHAMINE 15 MG/ML IJ SOLN
15.0000 mg | Freq: Four times a day (QID) | INTRAMUSCULAR | Status: AC
  Administered 2014-08-25 – 2014-08-26 (×3): 15 mg via INTRAVENOUS
  Filled 2014-08-25 (×3): qty 1

## 2014-08-25 MED ORDER — CEFAZOLIN SODIUM 1-5 GM-% IV SOLN
1.0000 g | Freq: Four times a day (QID) | INTRAVENOUS | Status: AC
  Administered 2014-08-25 (×2): 1 g via INTRAVENOUS
  Filled 2014-08-25 (×2): qty 50

## 2014-08-25 MED ORDER — LIDOCAINE HCL (CARDIAC) 20 MG/ML IV SOLN
INTRAVENOUS | Status: AC
Start: 2014-08-25 — End: 2014-08-25
  Filled 2014-08-25: qty 5

## 2014-08-25 MED ORDER — PROMETHAZINE HCL 25 MG/ML IJ SOLN: 6.2500 mg | INTRAMUSCULAR | Status: DC | PRN

## 2014-08-25 MED ORDER — DIPHENHYDRAMINE HCL 12.5 MG/5ML PO ELIX: 12.5000 mg | ORAL_SOLUTION | ORAL | Status: DC | PRN

## 2014-08-25 MED ORDER — NEOSTIGMINE METHYLSULFATE 10 MG/10ML IV SOLN
INTRAVENOUS | Status: DC | PRN
  Administered 2014-08-25: 3 mg via INTRAVENOUS

## 2014-08-25 MED ORDER — LOSARTAN POTASSIUM 50 MG PO TABS
100.0000 mg | ORAL_TABLET | Freq: Every day | ORAL | Status: DC
  Administered 2014-08-25 – 2014-08-27 (×2): 100 mg via ORAL
  Filled 2014-08-25 (×3): qty 2

## 2014-08-25 MED ORDER — LIDOCAINE HCL (CARDIAC) 20 MG/ML IV SOLN
INTRAVENOUS | Status: DC | PRN
  Administered 2014-08-25: 100 mg via INTRAVENOUS

## 2014-08-25 MED ORDER — MENTHOL 3 MG MT LOZG: 1.0000 | LOZENGE | OROMUCOSAL | Status: DC | PRN

## 2014-08-25 MED ORDER — ONDANSETRON HCL 4 MG PO TABS: 4.0000 mg | ORAL_TABLET | Freq: Four times a day (QID) | ORAL | Status: DC | PRN

## 2014-08-25 MED ORDER — HYDROCHLOROTHIAZIDE 25 MG PO TABS
25.0000 mg | ORAL_TABLET | Freq: Every day | ORAL | Status: DC
  Administered 2014-08-25 – 2014-08-27 (×2): 25 mg via ORAL
  Filled 2014-08-25 (×3): qty 1

## 2014-08-25 MED ORDER — FERROUS SULFATE 325 (65 FE) MG PO TABS
325.0000 mg | ORAL_TABLET | Freq: Three times a day (TID) | ORAL | Status: DC
  Administered 2014-08-25 – 2014-08-27 (×7): 325 mg via ORAL
  Filled 2014-08-25 (×9): qty 1

## 2014-08-25 MED ORDER — FENTANYL CITRATE 0.05 MG/ML IJ SOLN
25.0000 ug | INTRAMUSCULAR | Status: DC | PRN
  Administered 2014-08-25 (×3): 25 ug via INTRAVENOUS

## 2014-08-25 MED ORDER — ROCURONIUM BROMIDE 100 MG/10ML IV SOLN
INTRAVENOUS | Status: DC | PRN
  Administered 2014-08-25: 50 mg via INTRAVENOUS

## 2014-08-25 MED ORDER — EPHEDRINE SULFATE 50 MG/ML IJ SOLN
INTRAMUSCULAR | Status: DC | PRN
  Administered 2014-08-25 (×3): 10 mg via INTRAVENOUS

## 2014-08-25 MED ORDER — LOSARTAN POTASSIUM-HCTZ 100-25 MG PO TABS: 1.0000 | ORAL_TABLET | Freq: Every day | ORAL | Status: DC

## 2014-08-25 MED ORDER — PROPOFOL 10 MG/ML IV BOLUS
INTRAVENOUS | Status: DC | PRN
  Administered 2014-08-25: 170 mg via INTRAVENOUS

## 2014-08-25 MED ORDER — INFLUENZA VAC SPLIT QUAD 0.5 ML IM SUSY
0.5000 mL | PREFILLED_SYRINGE | INTRAMUSCULAR | Status: DC
  Filled 2014-08-25: qty 0.5

## 2014-08-25 MED ORDER — FENTANYL CITRATE 0.05 MG/ML IJ SOLN
INTRAMUSCULAR | Status: DC | PRN
  Administered 2014-08-25: 50 ug via INTRAVENOUS
  Administered 2014-08-25: 100 ug via INTRAVENOUS

## 2014-08-25 MED ORDER — SODIUM CHLORIDE 0.9 % IV SOLN
INTRAVENOUS | Status: DC
  Administered 2014-08-25 (×2): via INTRAVENOUS

## 2014-08-25 MED ORDER — HYDROMORPHONE HCL 1 MG/ML IJ SOLN: 1.0000 mg | INTRAMUSCULAR | Status: DC | PRN

## 2014-08-25 MED ORDER — FENTANYL CITRATE 0.05 MG/ML IJ SOLN
INTRAMUSCULAR | Status: AC
  Administered 2014-08-25: 25 ug via INTRAVENOUS
  Filled 2014-08-25: qty 2

## 2014-08-25 MED ORDER — LACTATED RINGERS IV SOLN
INTRAVENOUS | Status: DC | PRN
  Administered 2014-08-25 (×2): via INTRAVENOUS

## 2014-08-25 MED ORDER — PNEUMOCOCCAL VAC POLYVALENT 25 MCG/0.5ML IJ INJ
0.5000 mL | INJECTION | INTRAMUSCULAR | Status: DC
  Filled 2014-08-25 (×2): qty 0.5

## 2014-08-25 SURGICAL SUPPLY — 45 items
BLADE SAW SAG 73X25 THK (BLADE) ×2
BLADE SAW SGTL 73X25 THK (BLADE) ×1 IMPLANT
BLADE SURG 10 STRL SS (BLADE) IMPLANT
BLADE SURG 21 STRL SS (BLADE) ×3 IMPLANT
BRUSH FEMORAL CANAL (MISCELLANEOUS) IMPLANT
CAP POROUS W/METAL ON POLY ×2 IMPLANT
COVER BACK TABLE 24X17X13 BIG (DRAPES) IMPLANT
COVER SURGICAL LIGHT HANDLE (MISCELLANEOUS) ×3 IMPLANT
DRAPE INCISE IOBAN 85X60 (DRAPES) ×3 IMPLANT
DRAPE ORTHO SPLIT 77X108 STRL (DRAPES) ×6
DRAPE SURG ORHT 6 SPLT 77X108 (DRAPES) ×2 IMPLANT
DRAPE U-SHAPE 47X51 STRL (DRAPES) ×3 IMPLANT
DRSG MEPILEX BORDER 4X12 (GAUZE/BANDAGES/DRESSINGS) ×2 IMPLANT
DRSG MEPILEX BORDER 4X8 (GAUZE/BANDAGES/DRESSINGS) IMPLANT
DURAPREP 26ML APPLICATOR (WOUND CARE) ×3 IMPLANT
ELECT BLADE 6.5 EXT (BLADE) ×2 IMPLANT
ELECT CAUTERY BLADE 6.4 (BLADE) ×3 IMPLANT
ELECT REM PT RETURN 9FT ADLT (ELECTROSURGICAL) ×3
ELECTRODE REM PT RTRN 9FT ADLT (ELECTROSURGICAL) ×1 IMPLANT
GLOVE BIOGEL PI IND STRL 9 (GLOVE) ×1 IMPLANT
GLOVE BIOGEL PI INDICATOR 9 (GLOVE) ×2
GLOVE SURG ORTHO 9.0 STRL STRW (GLOVE) ×3 IMPLANT
GOWN STRL REUS W/ TWL XL LVL3 (GOWN DISPOSABLE) ×2 IMPLANT
GOWN STRL REUS W/TWL XL LVL3 (GOWN DISPOSABLE) ×6
HANDPIECE INTERPULSE COAX TIP (DISPOSABLE)
KIT BASIN OR (CUSTOM PROCEDURE TRAY) ×3 IMPLANT
KIT ROOM TURNOVER OR (KITS) ×3 IMPLANT
MANIFOLD NEPTUNE II (INSTRUMENTS) ×3 IMPLANT
NS IRRIG 1000ML POUR BTL (IV SOLUTION) ×3 IMPLANT
PACK TOTAL JOINT (CUSTOM PROCEDURE TRAY) ×3 IMPLANT
PAD ARMBOARD 7.5X6 YLW CONV (MISCELLANEOUS) ×6 IMPLANT
PRESSURIZER FEMORAL UNIV (MISCELLANEOUS) IMPLANT
SET HNDPC FAN SPRY TIP SCT (DISPOSABLE) IMPLANT
STAPLER VISISTAT 35W (STAPLE) ×3 IMPLANT
SUT ETHIBOND NAB CT1 #1 30IN (SUTURE) ×5 IMPLANT
SUT VIC AB 0 CT1 27 (SUTURE) ×6
SUT VIC AB 0 CT1 27XBRD ANBCTR (SUTURE) ×2 IMPLANT
SUT VIC AB 1 CTX 36 (SUTURE) ×3
SUT VIC AB 1 CTX36XBRD ANBCTR (SUTURE) ×1 IMPLANT
SUT VIC AB 2-0 CTB1 (SUTURE) ×6 IMPLANT
TOWEL OR 17X24 6PK STRL BLUE (TOWEL DISPOSABLE) ×3 IMPLANT
TOWEL OR 17X26 10 PK STRL BLUE (TOWEL DISPOSABLE) ×3 IMPLANT
TOWER CARTRIDGE SMART MIX (DISPOSABLE) IMPLANT
TRAY FOLEY CATH 16FRSI W/METER (SET/KITS/TRAYS/PACK) IMPLANT
WATER STERILE IRR 1000ML POUR (IV SOLUTION) ×9 IMPLANT

## 2014-08-25 NOTE — Transfer of Care (Signed)
Immediate Anesthesia Transfer of Care Note  Patient: Michael Moore  Procedure(s) Performed: Procedure(s) with comments: TOTAL HIP ARTHROPLASTY (Right) - Right Total Hip Arthroplasty  Patient Location: PACU  Anesthesia Type:General  Level of Consciousness: awake, alert , oriented and patient cooperative  Airway & Oxygen Therapy: Patient Spontanous Breathing and Patient connected to nasal cannula oxygen  Post-op Assessment: Report given to PACU RN, Post -op Vital signs reviewed and stable and Patient moving all extremities  Post vital signs: Reviewed and stable  Complications: No apparent anesthesia complications

## 2014-08-25 NOTE — Progress Notes (Signed)
Utilization review completed.  

## 2014-08-25 NOTE — Anesthesia Procedure Notes (Signed)
Procedure Name: Intubation Date/Time: 08/25/2014 8:34 AM Performed by: Julian Reil Pre-anesthesia Checklist: Patient identified, Emergency Drugs available, Suction available and Patient being monitored Patient Re-evaluated:Patient Re-evaluated prior to inductionOxygen Delivery Method: Circle system utilized Preoxygenation: Pre-oxygenation with 100% oxygen Intubation Type: IV induction Ventilation: Mask ventilation without difficulty Laryngoscope Size: Mac and 4 Grade View: Grade II Tube type: Oral Tube size: 7.5 mm Number of attempts: 1 Airway Equipment and Method: Stylet Placement Confirmation: ETT inserted through vocal cords under direct vision,  positive ETCO2 and breath sounds checked- equal and bilateral Secured at: 22 cm Tube secured with: Tape Dental Injury: Teeth and Oropharynx as per pre-operative assessment

## 2014-08-25 NOTE — H&P (Signed)
TOTAL HIP ADMISSION H&P  Patient is admitted for right total hip arthroplasty.  Subjective:  Chief Complaint: right hip pain  HPI: Michael Moore, 63 y.o. male, has a history of pain and functional disability in the right hip(s) due to arthritis and patient has failed non-surgical conservative treatments for greater than 12 weeks to include NSAID's and/or analgesics, weight reduction as appropriate and activity modification.  Onset of symptoms was gradual starting 8 years ago with gradually worsening course since that time.The patient noted no past surgery on the right hip(s).  Patient currently rates pain in the right hip at 8 out of 10 with activity. Patient has night pain, worsening of pain with activity and weight bearing, trendelenberg gait, pain that interfers with activities of daily living, pain with passive range of motion and crepitus. Patient has evidence of subchondral cysts, subchondral sclerosis, periarticular osteophytes and joint space narrowing by imaging studies. This condition presents safety issues increasing the risk of falls. This patient has had avascular necrosis of the hip, acetabular fracture, hip dysplasia.  There is no current active infection.  Patient Active Problem List   Diagnosis Date Noted  . Essential hypertension 04/13/2014  . History of aortic valve replacement 04/13/2014  . Status post left foot surgery 04/13/2014   Past Medical History  Diagnosis Date  . Hx of aortic root repair     dissection repair and AVR in 2008  . Hypertension   . Dysrhythmia     palpitations  . Umbilical hernia   . Arthritis     right hip    Past Surgical History  Procedure Laterality Date  . Aortic root replacement  04-07-2007    aortic dissection repair and AVR  . Foot surgery  09-03-2005  . Hernia repair      left in 03/1984; right in 03/1977  . Colonoscopy      Prescriptions prior to admission  Medication Sig Dispense Refill  . aspirin 81 MG tablet Take 81 mg by mouth  daily.      . calcium citrate-vitamin D (CITRACAL+D) 315-200 MG-UNIT per tablet Take 1 tablet by mouth daily.      Marland Kitchen losartan-hydrochlorothiazide (HYZAAR) 100-25 MG per tablet Take 1 tablet by mouth daily.       . metoprolol succinate (TOPROL-XL) 25 MG 24 hr tablet Take 25 mg by mouth daily.       . Multiple Vitamins-Minerals (MULTIVITAMIN PO) Take 1 tablet by mouth daily.       No Known Allergies  History  Substance Use Topics  . Smoking status: Never Smoker   . Smokeless tobacco: Never Used  . Alcohol Use: Yes     Comment: wine occasional    Family History  Problem Relation Age of Onset  . Heart attack Mother   . Heart attack Maternal Grandmother   . Heart attack Maternal Grandfather   . Stroke Paternal Grandmother   . Stroke Paternal Grandfather   . Heart attack Brother   . Heart attack Sister      Review of Systems  All other systems reviewed and are negative.   Objective:  Physical Exam  Vital signs in last 24 hours:    Labs:   Estimated body mass index is 33.36 kg/(m^2) as calculated from the following:   Height as of 04/13/14: 6' (1.829 m).   Weight as of 04/13/14: 111.585 kg (246 lb).   Imaging Review Plain radiographs demonstrate moderate degenerative joint disease of the right hip(s). The bone quality appears  to be adequate for age and reported activity level.  Assessment/Plan:  End stage arthritis, right hip(s)  The patient history, physical examination, clinical judgement of the provider and imaging studies are consistent with end stage degenerative joint disease of the right hip(s) and total hip arthroplasty is deemed medically necessary. The treatment options including medical management, injection therapy, arthroscopy and arthroplasty were discussed at length. The risks and benefits of total hip arthroplasty were presented and reviewed. The risks due to aseptic loosening, infection, stiffness, dislocation/subluxation,  thromboembolic complications and  other imponderables were discussed.  The patient acknowledged the explanation, agreed to proceed with the plan and consent was signed. Patient is being admitted for inpatient treatment for surgery, pain control, PT, OT, prophylactic antibiotics, VTE prophylaxis, progressive ambulation and ADL's and discharge planning.The patient is planning to be discharged home with home health services

## 2014-08-25 NOTE — Anesthesia Postprocedure Evaluation (Signed)
  Anesthesia Post-op Note  Patient: Michael Moore  Procedure(s) Performed: Procedure(s) with comments: TOTAL HIP ARTHROPLASTY (Right) - Right Total Hip Arthroplasty  Patient Location: PACU  Anesthesia Type:General  Level of Consciousness: awake, alert  and oriented  Airway and Oxygen Therapy: Patient Spontanous Breathing and Patient connected to nasal cannula oxygen  Post-op Pain: mild  Post-op Assessment: Post-op Vital signs reviewed, Patient's Cardiovascular Status Stable, Respiratory Function Stable, Patent Airway and No signs of Nausea or vomiting  Post-op Vital Signs: Reviewed and stable  Last Vitals:  Filed Vitals:   08/25/14 1041  BP:   Pulse: 48  Temp:   Resp: 10    Complications: No apparent anesthesia complications

## 2014-08-25 NOTE — Op Note (Signed)
08/25/2014  9:33 AM  PATIENT:  Michael Moore    PRE-OPERATIVE DIAGNOSIS:  Osteoarthritis Right Hip  POST-OPERATIVE DIAGNOSIS:  Same  PROCEDURE:  TOTAL HIP ARTHROPLASTY right  SURGEON:  Jerolene Kupfer V, MD  PHYSICIAN ASSISTANT:None ANESTHESIA:   General  PREOPERATIVE INDICATIONS:  Michael Moore is a  63 y.o. male with a diagnosis of Osteoarthritis Right Hip who failed conservative measures and elected for surgical management.    The risks benefits and alternatives were discussed with the patient preoperatively including but not limited to the risks of infection, bleeding, nerve injury, cardiopulmonary complications, the need for revision surgery, among others, and the patient was willing to proceed.  OPERATIVE IMPLANTS: Zimmer implants. Size 58 mm acetabulum. The 0 polyethylene liner. Size 10 femoral stem. Modular neck anteverted size BB. 36 mm head.  OPERATIVE FINDINGS: Multiple loose bodies.  OPERATIVE PROCEDURE: Patient was brought to the operating room and underwent a general anesthetic. After adequate levels of anesthesia were obtained patient was placed in the left lateral decubitus position with the right side up and the right lower extremity was prepped using DuraPrep draped in the sterile field and Ioban was used to cover all exposed skin. A posterior lateral incision was made this was carried down through the tensor fascia lata which was split. The piriformis short external rotators and capsule were incised off the femoral neck and retracted with Ethibond suture. The neck cut was made 1 cm proximal to the calcar the acetabulum was sequentially reamed to 58 mm and the 58 mm acetabulum was inserted in 45 abducted and 20 of anteversion. The liner was placed. The femur was then sequentially broached to a size 10 and a size 10 femur was inserted. This was trialed with different necks and the anteverted +0 neck was stable. The wounds were irrigated with normal saline throughout  the case. The final neck and ball were inserted. Again this was trialed and the neck was stable leg lengths were equal no laxity. The capsule. Form is an short external rotators were repaired using #1 Ethibond. The tensor fascia lata was closed using #1 Vicryl subcutaneous is closed using 0 Vicryl and skin was closed using staples. Mepilex dressing was applied. Patient was extubated taken to the PACU in stable condition.

## 2014-08-25 NOTE — Evaluation (Addendum)
Physical Therapy Evaluation Patient Details Name: Michael Moore MRN: [id number] DOB: May 10, 1951 Today's Date: 08/25/2014   History of Present Illness  pt is a 63 y.o. male s/p Rt THA - posterior, 08/25/14.  Clinical Impression  Pt is s/p Rt posterior THA POD#0  resulting in the deficits listed below (see PT Problem List).  Pt will benefit from skilled PT to increase their independence and safety with mobility to allow discharge to the venue listed below. Evaluation limited due to pt becoming unresponsive with syncopal like episode after standing for ~1 min. Pt returned to supine and then began to communicate; alert and oriented x 4, RN present during episode. Dressing also reinforced by RN during session. Pt with good rehab prognosis; anticipate D/C home tomorrow or Friday pending progress/pain management/medical status.     Follow Up Recommendations Home health PT;Supervision/Assistance - 24 hour    Equipment Recommendations  Rolling walker with 5" wheels;3in1 (PT)    Recommendations for Other Services OT consult     Precautions / Restrictions Precautions Precautions: Posterior Hip Precaution Comments: educated on posterior hip precautions; plan to bring handout next session Restrictions Weight Bearing Restrictions: Yes RLE Weight Bearing: Weight bearing as tolerated      Mobility  Bed Mobility Overal bed mobility: Needs Assistance Bed Mobility: Supine to Sit;Sit to Supine;Rolling Rolling: Min assist   Supine to sit: Min assist;HOB elevated Sit to supine: +2 for physical assistance;Mod assist (due to lightheaded/syncopal episode )   General bed mobility comments: (A) to advance Rt LE to/off EOB and cues for posterior hip precautions; pt required incr (A) of 2 to return to supine after becoming unrsponsive sitting EOB after standing  Transfers Overall transfer level: Needs assistance Equipment used: Rolling walker (2 wheeled) Transfers: Sit to/from Stand Sit to Stand:  Mod assist;From elevated surface         General transfer comment: cues for hand placement and sequencing with transfers; pt denied dizziness standing initially but c/o after standing ~1 min; returned to sitting position then supine ; educated pt and wife on having pillow between legs with rolling   Ambulation/Gait             General Gait Details: unable to safely assess today due to unresponsive episode   Stairs            Wheelchair Mobility    Modified Rankin (Stroke Patients Only)       Balance Overall balance assessment: Needs assistance Sitting-balance support: Feet supported;No upper extremity supported Sitting balance-Leahy Scale: Fair Sitting balance - Comments: sat EOB ~5 min; no c/o dizziness   Standing balance support: During functional activity;Bilateral upper extremity supported Standing balance-Leahy Scale: Poor Standing balance comment: relied on RW for standing balance                              Pertinent Vitals/Pain Pain Assessment: No/denies pain    Home Living Family/patient expects to be discharged to:: Private residence Living Arrangements: Spouse/significant other Available Help at Discharge: Family;Available 24 hours/day Type of Home: House Home Access: Stairs to enter Entrance Stairs-Rails: None Entrance Stairs-Number of Steps: 3 Home Layout: One level Home Equipment: Crutches      Prior Function Level of Independence: Independent               Hand Dominance        Extremity/Trunk Assessment   Upper Extremity Assessment: Defer to OT evaluation  Lower Extremity Assessment: RLE deficits/detail RLE Deficits / Details: hip 3/5; quad 4-/5    Cervical / Trunk Assessment: Normal  Communication   Communication: No difficulties  Cognition Arousal/Alertness: Awake/alert Behavior During Therapy: WFL for tasks assessed/performed Overall Cognitive Status: Within Functional Limits for tasks  assessed                      General Comments General comments (skin integrity, edema, etc.): educated on pillow between legs to prevent breaking hip precautions     Exercises Total Joint Exercises Ankle Circles/Pumps: AROM;Both;10 reps;Seated Long Arc Quad: AROM;Right;5 reps;Strengthening;Seated      Assessment/Plan    PT Assessment Patient needs continued PT services  PT Diagnosis Difficulty walking;Generalized weakness;Acute pain   PT Problem List Decreased range of motion;Decreased strength;Decreased activity tolerance;Decreased balance;Decreased coordination;Decreased safety awareness;Decreased knowledge of precautions;Pain  PT Treatment Interventions DME instruction;Gait training;Stair training;Functional mobility training;Therapeutic activities;Therapeutic exercise;Balance training;Neuromuscular re-education;Patient/family education   PT Goals (Current goals can be found in the Care Plan section) Acute Rehab PT Goals Patient Stated Goal: to go home tomorrow or friday PT Goal Formulation: With patient Time For Goal Achievement: 08/29/14 Potential to Achieve Goals: Good    Frequency 7X/week   Barriers to discharge        Co-evaluation               End of Session Equipment Utilized During Treatment: Gait belt;Oxygen Activity Tolerance: Other (comment) (lightheaded/syncopal episode ) Patient left: in bed;with call bell/phone within reach;with nursing/sitter in room Nurse Communication: Mobility status;Precautions         Time: 7737-3668 PT Time Calculation (min): 23 min   Charges:   PT Evaluation $Initial PT Evaluation Tier I: 1 Procedure PT Treatments $Therapeutic Activity: 8-22 mins   PT G CodesGustavus Bryant, [place]  [phone] 08/25/2014, 3:45 PM

## 2014-08-26 LAB — BASIC METABOLIC PANEL
Anion gap: 12 (ref 5–15)
BUN: 17 mg/dL (ref 6–23)
CALCIUM: 8.7 mg/dL (ref 8.4–10.5)
CO2: 25 meq/L (ref 19–32)
Chloride: 98 mEq/L (ref 96–112)
Creatinine, Ser: 0.99 mg/dL (ref 0.50–1.35)
GFR calc Af Amer: >90 mL/min (ref >90)
GFR, EST NON AFRICAN AMERICAN: 87 mL/min — AB (ref >90)
GLUCOSE: 124 mg/dL — AB (ref 70–99)
Potassium: 3.6 mEq/L — ABNORMAL LOW (ref 3.7–5.3)
Sodium: 135 mEq/L — ABNORMAL LOW (ref 137–147)

## 2014-08-26 LAB — CBC
HEMATOCRIT: 34.9 % — AB (ref 39.0–52.0)
HEMOGLOBIN: 12 g/dL — AB (ref 13.0–17.0)
MCH: 31.1 pg (ref 26.0–34.0)
MCHC: 34.4 g/dL (ref 30.0–36.0)
MCV: 90.4 fL (ref 78.0–100.0)
Platelets: 179 10*3/uL (ref 150–400)
RBC: 3.86 MIL/uL — ABNORMAL LOW (ref 4.22–5.81)
RDW: 13.1 % (ref 11.5–15.5)
WBC: 8.7 10*3/uL (ref 4.0–10.5)

## 2014-08-26 MED ORDER — OXYCODONE-ACETAMINOPHEN 5-325 MG PO TABS
1.0000 | ORAL_TABLET | ORAL | Status: DC | PRN
Start: 2014-08-26 — End: 2017-04-17

## 2014-08-26 MED ORDER — ASPIRIN EC 81 MG PO TBEC: 81.0000 mg | DELAYED_RELEASE_TABLET | Freq: Every day | ORAL | Status: DC

## 2014-08-26 NOTE — Progress Notes (Signed)
Physical Therapy Treatment Patient Details Name: Michael Moore MRN: [id number] DOB: 06-Nov-1950 Today's Date: 08/26/2014    History of Present Illness pt is a 63 y.o. male s/p Rt THA - posterior, 08/25/14.    PT Comments    Pt very motivated and progressing well with therapy. Excessive drainage noted from dressing; RN made aware and dressing reincorced by RN. Cont to follow PER POC.   Follow Up Recommendations  Home health PT;Supervision/Assistance - 24 hour     Equipment Recommendations  Rolling walker with 5" wheels;3in1 (PT)    Recommendations for Other Services OT consult     Precautions / Restrictions Precautions Precautions: Posterior Hip Precaution Comments: reviewed precautions; pt able to recall 1/3 independently  Restrictions Weight Bearing Restrictions: Yes RLE Weight Bearing: Weight bearing as tolerated    Mobility  Bed Mobility Overal bed mobility: Needs Assistance Bed Mobility: Supine to Sit     Supine to sit: Supervision;HOB elevated     General bed mobility comments: cues for technique to adhere to hip precautions   Transfers Overall transfer level: Needs assistance Equipment used: Rolling walker (2 wheeled) Transfers: Sit to/from Stand Sit to Stand: Min guard         General transfer comment: min guard to steady; cues for technique and safety  Ambulation/Gait Ambulation/Gait assistance: Min guard Ambulation Distance (Feet): 150 Feet Assistive device: Rolling walker (2 wheeled) Gait Pattern/deviations: Step-through pattern;Decreased step length - left;Decreased stance time - right;Antalgic Gait velocity: decr  Gait velocity interpretation: Below normal speed for age/gender General Gait Details: cues for gt sequencing to progress to step through gt and cues for upright posture    Stairs            Wheelchair Mobility    Modified Rankin (Stroke Patients Only)       Balance Overall balance assessment: Needs  assistance Sitting-balance support: Feet supported;No upper extremity supported Sitting balance-Leahy Scale: Good Sitting balance - Comments: sat EOB ~5 min this session no c/o pain   Standing balance support: During functional activity;Bilateral upper extremity supported Standing balance-Leahy Scale: Poor Standing balance comment: relying on RW for standing; min guard for safety; pt stood static for ~2 min while dressing reinforced by RN                    Cognition Arousal/Alertness: Awake/alert Behavior During Therapy: WFL for tasks assessed/performed Overall Cognitive Status: Within Functional Limits for tasks assessed       Memory: Decreased recall of precautions              Exercises Total Joint Exercises Ankle Circles/Pumps: AROM;Both;10 reps;Seated Quad Sets: AROM;Strengthening;Right;10 reps Gluteal Sets: 10 reps Hip ABduction/ADduction: AAROM;Strengthening;Right;10 reps;Seated Long Arc Quad: AROM;Right;5 reps;Strengthening;Seated    General Comments        Pertinent Vitals/Pain Pain Assessment: No/denies pain    Home Living                      Prior Function            PT Goals (current goals can now be found in the care plan section) Acute Rehab PT Goals Patient Stated Goal: home tomorrow PT Goal Formulation: With patient Time For Goal Achievement: 08/29/14 Potential to Achieve Goals: Good Progress towards PT goals: Progressing toward goals    Frequency  7X/week    PT Plan Current plan remains appropriate    Co-evaluation  End of Session Equipment Utilized During Treatment: Gait belt Activity Tolerance: Patient tolerated treatment well Patient left: in chair;with call bell/phone within reach     Time: 0913-0938 PT Time Calculation (min): 25 min  Charges:  $Gait Training: 8-22 mins $Therapeutic Exercise: 8-22 mins                    G Codes:      Elie Confer [place], [place] 08/26/2014, 10:26  AM

## 2014-08-26 NOTE — Evaluation (Addendum)
Occupational Therapy Evaluation Patient Details Name: CAMRYN QUESINBERRY MRN: [id number] DOB: September 22, 1951 Today's Date: 08/26/2014    History of Present Illness pt is a 63 y.o. male s/p Rt THA - posterior, 08/25/14.   Clinical Impression   This 63 yo male admitted and underwent above presents to acute OT with posterior hip precautions, decreased balance, and decreased mobility affecting his ability to be at an Independent level as he was pta. He will benefit from acute OT without need for follow up.    Follow Up Recommendations  No OT follow up    Equipment Recommendations  3 in 1 bedside comode       Precautions / Restrictions Precautions Precautions: Posterior Hip Precaution Comments: reviewed precautions; pt able to recall 1/3 independently  Restrictions Weight Bearing Restrictions: Yes RLE Weight Bearing: Weight bearing as tolerated      Mobility Transfers Overall transfer level: Needs assistance Equipment used: Rolling walker (2 wheeled) Transfers: Sit to/from Stand Sit to Stand: Min guard                ADL Overall ADL's : Needs assistance/impaired Eating/Feeding: Independent;Sitting   Grooming: Set up;Standing   Upper Body Bathing: Set up;Sitting   Lower Body Bathing: Moderate assistance (with min guard A sit<>stand)   Upper Body Dressing : Set up;Sitting   Lower Body Dressing: Moderate assistance (with min guard A sit<>stand)   Toilet Transfer: Min guard;Ambulation;RW (recliner>out door and around 1/4 unit>recliner)   Toileting- Clothing Manipulation and Hygiene: Min guard;Sit to/from stand         General ADL Comments: Pt reports that his wife will A him with LB ADLs until he can do them by himself. I made him aware of the most efficient way to get dressed post hip replacement surgery and he verbalized understanding.               Pertinent Vitals/Pain Pain Assessment: No/denies pain     Hand Dominance Right   Extremity/Trunk Assessment  Upper Extremity Assessment Upper Extremity Assessment: Overall WFL for tasks assessed           Communication Communication Communication: No difficulties   Cognition Arousal/Alertness: Awake/alert Behavior During Therapy: WFL for tasks assessed/performed Overall Cognitive Status: Within Functional Limits for tasks assessed       Memory: Decreased recall of precautions                        Home Living Family/patient expects to be discharged to:: Private residence Living Arrangements: Spouse/significant other Available Help at Discharge: Family;Available 24 hours/day Type of Home: House Home Access: Stairs to enter [employer] of Steps: 3 Entrance Stairs-Rails: None Home Layout: One level     Bathroom Shower/Tub: Tub/shower unit;Curtain (faucet on the right) Shower/tub characteristics: Curtain       Home Equipment: Crutches          Prior Functioning/Environment Level of Independence: Independent             OT Diagnosis: Generalized weakness   OT Problem List: Decreased strength;Decreased range of motion;Decreased knowledge of precautions;Impaired balance (sitting and/or standing)   OT Treatment/Interventions: Self-care/ADL training;Patient/family education;Balance training;DME and/or AE instruction    OT Goals(Current goals can be found in the care plan section) Acute Rehab OT Goals Patient Stated Goal: home tomorrow OT Goal Formulation: With patient Time For Goal Achievement: 08/28/14 Potential to Achieve Goals: Good ADL Goals Pt Will Perform Tub/Shower Transfer: Tub transfer;with min guard assist;ambulating;rolling walker;3 in  1  OT Frequency: Min 2X/week              End of Session Equipment Utilized During Treatment: Surveyor, mining Communication:  (nurse saw pt walking with me in the hallway)  Activity Tolerance: Patient tolerated treatment well Patient left: in chair;with call bell/phone within reach   Time:  1116-1134 OT Time Calculation (min): 18 min Charges:  OT General Charges $OT Visit: 1 Procedure OT Evaluation $Initial OT Evaluation Tier I: 1 Procedure OT Treatments $Self Care/Home Management : 8-22 mins  Almon Register [phone] 08/26/2014, 11:54 AM

## 2014-08-26 NOTE — Progress Notes (Signed)
Physical Therapy Treatment Patient Details Name: Michael Moore MRN: [id number] DOB: Dec 31, 1950 Today's Date: 08/26/2014    History of Present Illness pt is a 63 y.o. male s/p Rt THA - posterior, 08/25/14.    PT Comments    Pt progressing well with therapy. Wife present during session for education regarding mobility and stair management. Pt very motivated to progress and hopeful to D/C home tomorrow. Anticipate pt ready to be D/C from mobility standpoint after session in morning.   Follow Up Recommendations  Home health PT;Supervision/Assistance - 24 hour     Equipment Recommendations  Rolling walker with 5" wheels;3in1 (PT)    Recommendations for Other Services       Precautions / Restrictions Precautions Precautions: Posterior Hip Precaution Booklet Issued: Yes (comment) Precaution Comments: pt given handout and reviewed precautions  Restrictions Weight Bearing Restrictions: No RLE Weight Bearing: Weight bearing as tolerated    Mobility  Bed Mobility               General bed mobility comments: not addressed; pt up in chair and returned to chair   Transfers Overall transfer level: Needs assistance Equipment used: Rolling walker (2 wheeled) Transfers: Sit to/from Stand Sit to Stand: Supervision         General transfer comment: min cues for sequencing and precautions   Ambulation/Gait Ambulation/Gait assistance: Supervision Ambulation Distance (Feet): 200 Feet Assistive device: Rolling walker (2 wheeled) Gait Pattern/deviations: Step-to pattern;Antalgic;Decreased stance time - right;Decreased step length - left Gait velocity: decr  Gait velocity interpretation: Below normal speed for age/gender General Gait Details: cues for proper gt sequencing; pt initially with incr step length on Rt and decr on Lt; beginning to progress to step through gt with equal strides at end of session; cues for upright posture    Stairs Stairs: Yes Stairs assistance: Min  assist Stair Management: No rails;Step to pattern;Backwards;With walker Number of Stairs: 3 General stair comments: wife and pt educated on technique; handout given to reinforce sequencing; min (A) to manage RW   Wheelchair Mobility    Modified Rankin (Stroke Patients Only)       Balance Overall balance assessment: Needs assistance         Standing balance support: During functional activity;Bilateral upper extremity supported Standing balance-Leahy Scale: Poor Standing balance comment: pt relying on RW for balance                     Cognition Arousal/Alertness: Awake/alert Behavior During Therapy: WFL for tasks assessed/performed Overall Cognitive Status: Within Functional Limits for tasks assessed       Memory: Decreased recall of precautions              Exercises Total Joint Exercises Ankle Circles/Pumps: AROM;Both;10 reps;Seated Quad Sets: AROM;Strengthening;Right;10 reps Heel Slides: AROM;Right;10 reps;Seated;Other (comment) (to promote knee flexion; pt guarded) Hip ABduction/ADduction: AAROM;Strengthening;Right;10 reps;Seated Long Arc Quad: AROM;Right;Strengthening;Seated;10 reps    General Comments General comments (skin integrity, edema, etc.): given HEP handout and reviewed with pt       Pertinent Vitals/Pain Pain Assessment: No/denies pain    Home Living Family/patient expects to be discharged to:: Private residence Living Arrangements: Spouse/significant other Available Help at Discharge: Family;Available 24 hours/day Type of Home: House Home Access: Stairs to enter Entrance Stairs-Rails: None Home Layout: One level Home Equipment: Crutches      Prior Function Level of Independence: Independent          PT Goals (current goals can now be found in the  care plan section) Acute Rehab PT Goals Patient Stated Goal: home tomorrow  PT Goal Formulation: With patient Time For Goal Achievement: 08/29/14 Potential to Achieve Goals:  Good Progress towards PT goals: Progressing toward goals    Frequency  7X/week    PT Plan Current plan remains appropriate    Co-evaluation             End of Session Equipment Utilized During Treatment: Gait belt Activity Tolerance: Patient tolerated treatment well Patient left: Other (comment) (in gym with OT)     Time: 9528-4132 PT Time Calculation (min): 23 min  Charges:  $Gait Training: 8-22 mins $Therapeutic Exercise: 8-22 mins                    G Codes:      Elie Confer [place], [place] 08/26/2014, 3:08 PM

## 2014-08-26 NOTE — Progress Notes (Signed)
Patient ID: Michael Moore, male   DOB: 1951-06-16, 63 y.o.   MRN: [id number] Postoperative day 1 total hip arthroplasty on the right. Plan for discharge to home on Friday postoperative day 2. Prescriptions on the chart.

## 2014-08-26 NOTE — Progress Notes (Signed)
Occupational Therapy Treatment and Discharge Patient Details Name: Michael Moore MRN: [id number] DOB: 01-19-1951 Today's Date: 08/26/2014    History of present illness pt is a 63 y.o. male s/p Rt THA - posterior, 08/25/14.   OT comments  This 63 yo male seen this second session for tub transfer to 3n1--goal met and education complete, we will D/C from acute OT.  Follow Up Recommendations  No OT follow up    Equipment Recommendations  3 in 1 bedside comode       Precautions / Restrictions Precautions Precautions: Posterior Hip Restrictions Weight Bearing Restrictions: No RLE Weight Bearing: Weight bearing as tolerated       Mobility Transfers Overall transfer level: Needs assistance Equipment used: Rolling walker (2 wheeled) Transfers: Sit to/from Stand Sit to Stand: Supervision         General transfer comment: Pt ambulated from gym back to his room 5N18 with S with RW        ADL Overall ADL's : Needs assistance/impaired      Tub/ Shower Transfer: Tub transfer;Supervision/safety;Ambulation;Rolling walker;3 in 1 (pt just has to get clarification from MD as to when he can get incision wet--he is aware that he needs to know this)                    Cognition   Behavior During Therapy: [facility] for tasks assessed/performed Overall Cognitive Status: Within Functional Limits for tasks assessed                                    Pertinent Vitals/ Pain       Pain Assessment: No/denies pain  Home Living Family/patient expects to be discharged to:: Private residence Living Arrangements: Spouse/significant other Available Help at Discharge: Family;Available 24 hours/day Type of Home: House Home Access: Stairs to enter [employer] of Steps: 3 Entrance Stairs-Rails: None Home Layout: One level     Bathroom Shower/Tub: Tub/shower unit;Curtain (faucet on the right) Shower/tub characteristics: Curtain       Home Equipment: Crutches          Prior Functioning/Environment Level of Independence: Independent            Frequency Min 2X/week     Progress Toward Goals  OT Goals(current goals can now be found in the care plan section)  Progress towards OT goals: Goals met/education completed, patient discharged from OT  Acute Rehab OT Goals Patient Stated Goal: home tomorrow OT Goal Formulation: With patient Time For Goal Achievement: 08/28/14 Potential to Achieve Goals: Good ADL Goals Pt Will Perform Tub/Shower Transfer: Tub transfer;with min guard assist;ambulating;rolling walker;3 in 1  Plan Discharge plan remains appropriate       End of Session Equipment Utilized During Treatment: Rolling walker   Activity Tolerance Patient tolerated treatment well   Patient Left in chair;with call bell/phone within reach;with family/visitor present   Nurse Communication  (nurse saw pt walking with me in the hallway)        Time: 2248-2500 OT Time Calculation (min): 12 min  Charges: OT General Charges $OT Visit: 1 Procedure OT Evaluation $Initial OT Evaluation Tier I: 1 Procedure OT Treatments $Self Care/Home Management : 8-22 mins  Almon Register [phone] 08/26/2014, 2:35 PM

## 2014-08-26 NOTE — Discharge Instructions (Signed)
May shower and get incision wet.

## 2014-08-27 ENCOUNTER — Encounter (HOSPITAL_COMMUNITY): Payer: Self-pay | Admitting: Orthopedic Surgery

## 2014-08-27 LAB — BASIC METABOLIC PANEL
Anion gap: 13 (ref 5–15)
BUN: 14 mg/dL (ref 6–23)
CO2: 25 mEq/L (ref 19–32)
Calcium: 8.5 mg/dL (ref 8.4–10.5)
Chloride: 99 mEq/L (ref 96–112)
Creatinine, Ser: 0.88 mg/dL (ref 0.50–1.35)
GFR calc Af Amer: >90 mL/min (ref >90)
GLUCOSE: 161 mg/dL — AB (ref 70–99)
POTASSIUM: 3.5 meq/L — AB (ref 3.7–5.3)
Sodium: 137 mEq/L (ref 137–147)

## 2014-08-27 LAB — CBC
HEMATOCRIT: 35.7 % — AB (ref 39.0–52.0)
HEMOGLOBIN: 12 g/dL — AB (ref 13.0–17.0)
MCH: 30.8 pg (ref 26.0–34.0)
MCHC: 33.6 g/dL (ref 30.0–36.0)
MCV: 91.5 fL (ref 78.0–100.0)
Platelets: 178 10*3/uL (ref 150–400)
RBC: 3.9 MIL/uL — AB (ref 4.22–5.81)
RDW: 13.2 % (ref 11.5–15.5)
WBC: 7.7 10*3/uL (ref 4.0–10.5)

## 2014-08-27 NOTE — Care Management Note (Signed)
CARE MANAGEMENT NOTE 08/27/2014  Patient:  Michael Moore, Michael Moore   Account Number:  [id number]  Date Initiated:  08/26/2014  Documentation initiated by:  Ricki Miller  Subjective/Objective Assessment:   63 yr old male admitted with osteoarthritis of right hip. Patient had a right total hip arthroplasty.     Action/Plan:   Case manager spoke with patient concerning home health and DME needs. Patient preoperatively setup with [facility], no changes. Patient has family support at discharge.   Anticipated DC Date:  08/27/2014   Anticipated DC Plan:  [place]  CM consult      [place]   Choice offered to / List presented to:     DME arranged  [place]  3-N-1      DME agency  [place] arranged  [place]   Status of service:  Completed, signed off Medicare Important Message given?   (If response is "NO", the following Medicare IM given date fields will be blank) Date Medicare IM given:   Medicare IM given by:   Date Additional Medicare IM given:   Additional Medicare IM given by:    Discharge Disposition:  [place]  Per UR Regulation:  Reviewed for med. necessity/level of care/duration of stay  If discussed at [place] of Stay Meetings, dates discussed:    Comments:  08/27/14 10:00am Ricki Miller, RN BSN Case Manager Case manager verified patient's physical address: [address] [place], [place] [address]. Provided this information to [place] with [facility].

## 2014-08-27 NOTE — Progress Notes (Signed)
Physical Therapy Treatment Patient Details Name: Michael Moore MRN: [id number] DOB: [birth year] Today's Date: 08/27/2014    History of Present Illness pt is a 63 y.o. male s/p Rt THA - posterior, 08/25/14.    PT Comments    Pt mobilizing at supervision level. Reviewed HEP, stair management technique and car transfer technique with pt. Pt ready for D/C from mobility standpoint.   Follow Up Recommendations  Home health PT;Supervision/Assistance - 24 hour     Equipment Recommendations  Rolling walker with 5" wheels;3in1 (PT)    Recommendations for Other Services       Precautions / Restrictions Precautions Precautions: Posterior Hip Precaution Comments: reviewed handout; pt able to independently recall 2/3 precautions  Restrictions Weight Bearing Restrictions: No RLE Weight Bearing: Weight bearing as tolerated    Mobility  Bed Mobility               General bed mobility comments: reviewed technique   Transfers Overall transfer level: Needs assistance Equipment used: Rolling walker (2 wheeled) Transfers: Sit to/from Stand Sit to Stand: Supervision         General transfer comment: supervision to slide Rt LE out prior to sitting or standing to adhere to hip precautions   Ambulation/Gait Ambulation/Gait assistance: Supervision Ambulation Distance (Feet): 300 Feet Assistive device: Rolling walker (2 wheeled) Gait Pattern/deviations: Step-to pattern;Step-through pattern;Decreased step length - left;Decreased stance time - right;Wide base of support;Trunk flexed Gait velocity: decr  Gait velocity interpretation: Below normal speed for age/gender General Gait Details: initially ambulating with incr step length on Rt ; with cues progressing to equal step lengths; cues for upright posture and safety with directional changes for hip precautions   Stairs Stairs:  (verbally reviewed and pt has handout )          Wheelchair Mobility    Modified Rankin (Stroke  Patients Only)       Balance                                    Cognition Arousal/Alertness: Awake/alert Behavior During Therapy: WFL for tasks assessed/performed Overall Cognitive Status: Within Functional Limits for tasks assessed       Memory: Decreased recall of precautions              Exercises Total Joint Exercises Ankle Circles/Pumps: AROM;Both;10 reps;Seated Quad Sets: AROM;Strengthening;Right;10 reps Gluteal Sets: 10 reps Long Arc Quad: AROM;Right;Strengthening;Seated;10 reps Knee Flexion: AROM;Right;10 reps;Standing Marching in Standing: AROM;Strengthening;Right;10 reps;Standing    General Comments General comments (skin integrity, edema, etc.): reviewed HEP with pt and car transfer technique       Pertinent Vitals/Pain Pain Assessment: No/denies pain    Home Living                      Prior Function            PT Goals (current goals can now be found in the care plan section) Acute Rehab PT Goals Patient Stated Goal: home before lunch today and church on sunday PT Goal Formulation: All assessment and education complete, DC therapy Time For Goal Achievement: 08/29/14 Potential to Achieve Goals: Good Progress towards PT goals: Goals met/education completed, patient discharged from PT    Frequency  7X/week    PT Plan Current plan remains appropriate    Co-evaluation             End of Session Equipment Utilized  During Treatment: Gait belt Activity Tolerance: Patient tolerated treatment well Patient left: in chair;with call bell/phone within reach     Time: 0831-0855 PT Time Calculation (min): 24 min  Charges:  $Gait Training: 8-22 mins $Therapeutic Exercise: 8-22 mins                    G Codes:      Gustavus Bryant, [place] 08/27/2014, 9:20 AM

## 2014-08-27 NOTE — Discharge Summary (Signed)
Patient ID: Michael Moore MRN: [id number] DOB/AGE: 1950/11/11 63 y.o.  Admit date: 08/25/2014 Discharge date: 08/27/2014  Admission Diagnoses:  Active Problems:   S/P total hip arthroplasty   Discharge Diagnoses:  Same  Past Medical History  Diagnosis Date  . Hx of aortic root repair     dissection repair and AVR in 2008  . Hypertension   . Dysrhythmia     palpitations  . Umbilical hernia   . Arthritis     right hip    Surgeries: Procedure(s): TOTAL HIP ARTHROPLASTY on 08/25/2014   Consultants:    Discharged Condition: Improved  Hospital Course: Michael Moore is an 63 y.o. male who was admitted 08/25/2014 for operative treatment of<principal problem not specified>. Patient has severe unremitting pain that affects sleep, daily activities, and work/hobbies. After pre-op clearance the patient was taken to the operating room on 08/25/2014 and underwent  Procedure(s): TOTAL HIP ARTHROPLASTY.    Patient was given perioperative antibiotics: Anti-infectives   Start     Dose/Rate Route Frequency Ordered Stop   08/25/14 1430  ceFAZolin (ANCEF) IVPB 1 g/50 mL premix     1 g 100 mL/hr over 30 Minutes Intravenous Every 6 hours 08/25/14 1225 08/25/14 2041   08/25/14 0600  ceFAZolin (ANCEF) IVPB 2 g/50 mL premix     2 g 100 mL/hr over 30 Minutes Intravenous On call to O.R. 08/24/14 1406 08/25/14 0840       Patient was given sequential compression devices, early ambulation, and chemoprophylaxis to prevent DVT.  Patient benefited maximally from hospital stay and there were no complications.    Recent vital signs: Patient Vitals for the past 24 hrs:  BP Temp Temp src Pulse Resp SpO2  08/27/14 0538 127/61 mmHg 98.4 F (36.9 C) Oral 69 16 96 %  08/26/14 2127 138/65 mmHg 98.7 F (37.1 C) Oral 69 16 94 %  08/26/14 1436 129/52 mmHg 99.2 F (37.3 C) Oral 68 18 97 %  08/26/14 0950 127/48 mmHg 98.6 F (37 C) Oral 67 18 96 %  08/26/14 0800 - - - - 14 95 %     Recent  laboratory studies:  Recent Labs  08/26/14 0515  WBC 8.7  HGB 12.0*  HCT 34.9*  PLT 179  NA 135*  K 3.6*  CL 98  CO2 25  BUN 17  CREATININE 0.99  GLUCOSE 124*  CALCIUM 8.7     Discharge Medications:     Medication List         aspirin 81 MG tablet  Take 81 mg by mouth daily.     aspirin EC 81 MG tablet  Take 1 tablet (81 mg total) by mouth daily.     calcium citrate-vitamin D 315-200 MG-UNIT per tablet  Commonly known as:  CITRACAL+D  Take 1 tablet by mouth daily.     losartan-hydrochlorothiazide 100-25 MG per tablet  Commonly known as:  HYZAAR  Take 1 tablet by mouth daily.     metoprolol succinate 25 MG 24 hr tablet  Commonly known as:  TOPROL-XL  Take 25 mg by mouth daily.     MULTIVITAMIN PO  Take 1 tablet by mouth daily.     oxyCODONE-acetaminophen 5-325 MG per tablet  Commonly known as:  ROXICET  Take 1 tablet by mouth every 4 (four) hours as needed for severe pain.        Diagnostic Studies: Dg Chest 2 View  08/11/2014   CLINICAL DATA:  Preop for right hip replacement history  of CABG and aortic valve replacement in 2008  EXAM: CHEST  2 VIEW  COMPARISON:  CT chest of 04/07/2007 and chest x-ray of the same day  FINDINGS: No focal infiltrate or effusion is seen. Bibasilar linear atelectasis and scarring is noted. The heart is mildly enlarged and stable. Median sternotomy sutures are noted. There are degenerative changes in the thoracic spine.  IMPRESSION: Bibasilar linear atelectasis or scarring. No definite active process. Stable mild cardiomegaly.   Electronically Signed   By: Ivar Drape M.D.   On: 08/11/2014 16:35    Disposition: to home      Discharge Instructions   Call MD / Call 911    Complete by:  As directed   If you experience chest pain or shortness of breath, CALL 911 and be transported to the hospital emergency room.  If you develope a fever above 101 F, pus (white drainage) or increased drainage or redness at the wound, or calf pain,  call your surgeon's office.     Constipation Prevention    Complete by:  As directed   Drink plenty of fluids.  Prune juice may be helpful.  You may use a stool softener, such as Colace (over the counter) 100 mg twice a day.  Use MiraLax (over the counter) for constipation as needed.     Diet - low sodium heart healthy    Complete by:  As directed      Discharge patient    Complete by:  As directed      Increase activity slowly as tolerated    Complete by:  As directed      Weight bearing as tolerated    Complete by:  As directed            Follow-up Information   Follow up with DUDA,MARCUS V, MD In 2 weeks.   Specialty:  Orthopedic Surgery   Contact information:   [place] [address] [phone]        Signed: Mcarthur Rossetti 08/27/2014, 6:26 AM

## 2014-08-27 NOTE — Progress Notes (Signed)
Subjective: 2 Days Post-Op Procedure(s) (LRB): TOTAL HIP ARTHROPLASTY (Right) Patient reports pain as mild.  Doing well overall.  Very motivated.  Objective: Vital signs in last 24 hours: Temp:  [98.4 F (36.9 C)-99.2 F (37.3 C)] 98.4 F (36.9 C) (10/30 0538) Pulse Rate:  [67-69] 69 (10/30 0538) Resp:  [14-18] 16 (10/30 0538) BP: (127-138)/(48-65) 127/61 mmHg (10/30 0538) SpO2:  [94 %-97 %] 96 % (10/30 0538)  Intake/Output from previous day: 10/29 0701 - 10/30 0700 In: 1320 [P.O.:1320] Out: 1075 [Urine:1075] Intake/Output this shift: Total I/O In: 840 [P.O.:840] Out: 1075 [Urine:1075]   Recent Labs  08/26/14 0515  HGB 12.0*    Recent Labs  08/26/14 0515  WBC 8.7  RBC 3.86*  HCT 34.9*  PLT 179    Recent Labs  08/26/14 0515  NA 135*  K 3.6*  CL 98  CO2 25  BUN 17  CREATININE 0.99  GLUCOSE 124*  CALCIUM 8.7   No results found for this basename: LABPT, INR,  in the last 72 hours  Sensation intact distally Intact pulses distally Dorsiflexion/Plantar flexion intact Incision: dressing C/D/I  Assessment/Plan: 2 Days Post-Op Procedure(s) (LRB): TOTAL HIP ARTHROPLASTY (Right) Discharge home with home health today.  Michael Moore Y 08/27/2014, 6:25 AM

## 2015-05-03 ENCOUNTER — Other Ambulatory Visit: Payer: Self-pay | Admitting: Surgery

## 2015-05-03 NOTE — H&P (Signed)
Michael Moore 05/03/2015 1:31 PM Location: [facility] Patient #: [id number] DOB: [birth year] Married / Language: English / Race: Black or African American Male History of Present Illness [name]; 05/03/2015 2:20 PM) Patient words: hernia.  The patient is a 64 year old male who presents with an umbilical hernia. Patient sent by Michael Moore primary care physician [name] for consultation given concern of umbilical hernia Pleasant active male. Had Type A aortic dissection - (status post emergent surgery by Dr. Clementeen Graham on 6//2008 involving a 27 mm Medtronic Freestyle bioprosthesis and replacement of the ascending aorta with a 24 mm Dacron graft). He is doing well. Echo done Fall 2015 at [facility] showed normal appearing bioprosthesis with mean gradient <10 and no AI. Very active. Recalls having inguinal hernia repairs when he was a teenager. No groin pain or discomfort. He noticed some swelling in Michael Moore bellybutton about a year ago. He thinks it's gotten slightly larger. Sometimes sensitive but no severe pain or discomfort. No nausea or vomiting. Has daily bowel movements. He is on 81 mg aspirin but no blood thinners. Has had some joint surgery in the past few years without incident. Of concern of the moderate size hernia, surgical consultation requested. No history of wound infections. Other Problems [name], CMA; 05/03/2015 1:32 PM) High blood pressure  Past Surgical History [name], CMA; 05/03/2015 1:32 PM) Aneurysm Repair Foot Surgery Left.  Diagnostic Studies History [name], CMA; 05/03/2015 1:32 PM) Colonoscopy 1-5 years ago  Allergies [name], CMA; 05/03/2015 1:32 PM) No Known Drug Allergies 05/03/2015  Medication History ([name], CMA; 05/03/2015 1:34 PM) Aspirin EC (81MG  Tablet DR, Oral) Active. Losartan Potassium-HCTZ (100-25MG  Tablet, Oral) Active. Metoprolol Succinate ER (25MG  Tablet ER 24HR, Oral) Active. Calcium (1500MG  Tablet, Oral)  Active. Multivitamin Adults 50+ (Oral) Active. Medications Reconciled  Social History [name], CMA; 05/03/2015 1:32 PM) Alcohol use Remotely quit alcohol use. Caffeine use Coffee. No drug use Tobacco use Never smoker.  Family History [name], CMA; 05/03/2015 1:32 PM) Heart Disease Brother.     Review of Systems [name] CMA; 05/03/2015 1:32 PM) General Not Present- Appetite Loss, Chills, Fatigue, Fever, Night Sweats, Weight Gain and Weight Loss. Skin Not Present- Change in Wart/Mole, Dryness, Hives, Jaundice, New Lesions, Non-Healing Wounds, Rash and Ulcer. HEENT Present- Seasonal Allergies. Not Present- Earache, Hearing Loss, Hoarseness, Nose Bleed, Oral Ulcers, Ringing in the Ears, Sinus Pain, Sore Throat, Visual Disturbances, Wears glasses/contact lenses and Yellow Eyes. Respiratory Not Present- Bloody sputum, Chronic Cough, Difficulty Breathing, Snoring and Wheezing. Breast Not Present- Breast Mass, Breast Pain, Nipple Discharge and Skin Changes. Cardiovascular Not Present- Chest Pain, Difficulty Breathing Lying Down, Leg Cramps, Palpitations, Rapid Heart Rate, Shortness of Breath and Swelling of Extremities. Gastrointestinal Not Present- Abdominal Pain, Bloating, Bloody Stool, Change in Bowel Habits, Chronic diarrhea, Constipation, Difficulty Swallowing, Excessive gas, Gets full quickly at meals, Hemorrhoids, Indigestion, Nausea, Rectal Pain and Vomiting. Male Genitourinary Not Present- Blood in Urine, Change in Urinary Stream, Frequency, Impotence, Nocturia, Painful Urination, Urgency and Urine Leakage. Musculoskeletal Not Present- Back Pain, Joint Pain, Joint Stiffness, Muscle Pain, Muscle Weakness and Swelling of Extremities. Neurological Not Present- Decreased Memory, Fainting, Headaches, Numbness, Seizures, Tingling, Tremor, Trouble walking and Weakness. Psychiatric Not Present- Anxiety, Bipolar, Change in Sleep Pattern, Depression, Fearful and Frequent  crying. Endocrine Not Present- Cold Intolerance, Excessive Hunger, Hair Changes, Heat Intolerance, Hot flashes and New Diabetes. Hematology Not Present- Easy Bruising, Excessive bleeding, Gland problems, HIV and Persistent Infections.  Vitals (  [name] CMA; 05/03/2015 1:32 PM) 05/03/2015 1:32 PM Weight: 238 lb Height: 72in Body Surface Area: 2.34 m Body Mass Index: 32.28 kg/m Temp.: 97.29F(Temporal)  Pulse: 77 (Regular)  BP: 130/76 (Sitting, Left Arm, Standard)     Physical Exam [name]; 05/03/2015 2:12 PM)  General Mental Status-Alert. General Appearance-Not in acute distress, Not Sickly. Orientation-Oriented X3. Hydration-Well hydrated. Voice-Normal.  Integumentary Global Assessment Upon inspection and palpation of skin surfaces of the - Axillae: non-tender, no inflammation or ulceration, no drainage. and Distribution of scalp and body hair is normal. General Characteristics Temperature - normal warmth is noted.  Head and Neck Head-normocephalic, atraumatic with no lesions or palpable masses. Face Global Assessment - atraumatic, no absence of expression. Neck Global Assessment - no abnormal movements, no bruit auscultated on the right, no bruit auscultated on the left, no decreased range of motion, non-tender. Trachea-midline. Thyroid Gland Characteristics - non-tender.  Eye Eyeball - Left-Extraocular movements intact, No Nystagmus. Eyeball - Right-Extraocular movements intact, No Nystagmus. Cornea - Left-No Hazy. Cornea - Right-No Hazy. Sclera/Conjunctiva - Left-No scleral icterus, No Discharge. Sclera/Conjunctiva - Right-No scleral icterus, No Discharge. Pupil - Left-Direct reaction to light normal. Pupil - Right-Direct reaction to light normal.  ENMT Ears Pinna - Left - no drainage observed, no generalized tenderness observed. Right - no drainage observed, no generalized tenderness observed. Nose and  Sinuses External Inspection of the Nose - no destructive lesion observed. Inspection of the nares - Left - quiet respiration. Right - quiet respiration. Mouth and Throat Lips - Upper Lip - no fissures observed, no pallor noted. Lower Lip - no fissures observed, no pallor noted. Nasopharynx - no discharge present. Oral Cavity/Oropharynx - Tongue - no dryness observed. Oral Mucosa - no cyanosis observed. Hypopharynx - no evidence of airway distress observed.  Chest and Lung Exam Inspection Movements - Normal and Symmetrical. Accessory muscles - No use of accessory muscles in breathing. Palpation Palpation of the chest reveals - Non-tender. Auscultation Breath sounds - Normal and Clear. Note: Well-healed sternotomy incision   Cardiovascular Auscultation Rhythm - Regular. Murmurs & Other Heart Sounds - Auscultation of the heart reveals - No Murmurs and No Systolic Clicks.  Abdomen Inspection Inspection of the abdomen reveals - No Visible peristalsis and No Abnormal pulsations. Umbilicus - No Bleeding, No Urine drainage. Palpation/Percussion Palpation and Percussion of the abdomen reveal - Soft, Non Tender, No Rebound tenderness, No Rigidity (guarding) and No Cutaneous hyperesthesia. Note: Moderate size diastases recti supraumbilically resolved. Obvious 4 x 4 centimeter periumbilical mass that reduces down to 2.5 centimeter umbilical fascial defect.   Male Genitourinary Sexual Maturity Tanner 5 - Adult hair pattern and Adult penile size and shape. Note: Normal external male genitalia. Testes and cords normal. Mild impulse on RIGHT groin but no definite hernia. No impulse on LEFT. No pain.   Peripheral Vascular Upper Extremity Inspection - Left - No Cyanotic nailbeds, Not Ischemic. Right - No Cyanotic nailbeds, Not Ischemic.  Neurologic Neurologic evaluation reveals -normal attention span and ability to concentrate, able to name objects and repeat phrases. Appropriate fund of  knowledge , normal sensation and normal coordination. Mental Status Affect - not angry, not paranoid. Cranial Nerves-Normal Bilaterally. Gait-Normal.  Neuropsychiatric Mental status exam performed with findings of-able to articulate well with normal speech/language, rate, volume and coherence, thought content normal with ability to perform basic computations and apply abstract reasoning and no evidence of hallucinations, delusions, obsessions or homicidal/suicidal ideation.  Musculoskeletal Global Assessment Spine, Ribs and Pelvis - no  instability, subluxation or laxity. Right Upper Extremity - no instability, subluxation or laxity.  Lymphatic Head & Neck  General Head & Neck Lymphatics: Bilateral - Description - No Localized lymphadenopathy. Axillary  General Axillary Region: Bilateral - Description - No Localized lymphadenopathy. Femoral & Inguinal  Generalized Femoral & Inguinal Lymphatics: Left - Description - No Localized lymphadenopathy. Right - Description - No Localized lymphadenopathy.    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 Hopefully just an outpatient laparoscopic repair  Given Michael Moore diastases and Michael Moore large size, I think mesh underlay repair would help minimize a chance of recurrence. Good for laparoscopic approach. I doubt he has recurrent inguinal hernias.  He wishes to think about things. He is leaning toward having a repaired this year. It educate him on risks of a emergency situation. I will leave it for him to call me when he is ready to consider surgery  Current Plans Schedule for Surgery Written instructions provided The anatomy & physiology of the abdominal wall was discussed. The pathophysiology of hernias was  discussed. Natural history risks without surgery including progeressive enlargement, pain, incarceration, & strangulation was discussed. Contributors to complications such as smoking, obesity, diabetes, prior surgery, etc were discussed.  I feel the risks of no intervention will lead to serious problems that outweigh the operative risks; therefore, I recommended surgery to reduce and repair the hernia. I explained laparoscopic techniques with possible need for an open approach. I noted the probable use of mesh to patch and/or buttress the hernia repair  Risks such as bleeding, infection, abscess, need for further treatment, heart attack, death, and other risks were discussed. I noted a good likelihood this will help address the problem. Goals of post-operative recovery were discussed as well. Possibility that this will not correct all symptoms was explained. I stressed the importance of low-impact activity, aggressive pain control, avoiding constipation, & not pushing through pain to minimize risk of post-operative chronic pain or injury. Possibility of reherniation especially with smoking, obesity, diabetes, immunosuppression, and other health conditions was discussed. We will work to minimize complications.  An educational handout further explaining the pathology & treatment options was given as well. Questions were answered. The patient expresses understanding & wishes to proceed with surgery. Pt Education - Pamphlet Given - Laparoscopic Hernia Repair: discussed with patient and provided information. Pt Education - [facility] Hernia Post-Op HCI (Karl Knarr): discussed with patient and provided information. Pt Education - [facility] Pain Control (Lamaj Metoyer)  Adin Hector, M.D., F.A.C.S. Gastrointestinal and Minimally Invasive Surgery [facility] [address] [place], [place] [address] [phone] Main / Paging

## 2015-05-20 ENCOUNTER — Ambulatory Visit: Payer: Self-pay | Admitting: Podiatry

## 2015-06-03 ENCOUNTER — Ambulatory Visit (INDEPENDENT_AMBULATORY_CARE_PROVIDER_SITE_OTHER): Payer: Federal, State, Local not specified - PPO | Admitting: Podiatry

## 2015-06-03 ENCOUNTER — Encounter: Payer: Self-pay | Admitting: Podiatry

## 2015-06-03 VITALS — BP 130/66 | HR 54 | Resp 17

## 2015-06-03 DIAGNOSIS — M79673 Pain in unspecified foot: Secondary | ICD-10-CM

## 2015-06-03 DIAGNOSIS — B351 Tinea unguium: Secondary | ICD-10-CM

## 2015-06-03 DIAGNOSIS — M79609 Pain in unspecified limb: Secondary | ICD-10-CM

## 2015-06-03 NOTE — Progress Notes (Signed)
Patient ID: Michael Moore, male   DOB: 24-Jul-1951, 64 y.o.   MRN: [id number] Complaint:  Visit Type: Patient returns to my office for continued preventative foot care services. Complaint: Patient states" my nails have grown long and thick and become painful to walk and wear shoes" . The patient presents for preventative foot care services. No changes to ROS  Podiatric Exam: Vascular: dorsalis pedis and posterior tibial pulses are palpable bilateral. Capillary return is immediate. Temperature gradient is WNL. Skin turgor WNL  Sensorium: Normal Semmes Weinstein monofilament test. Normal tactile sensation bilaterally. Nail Exam: Pt has thick disfigured discolored nails with subungual debris noted bilateral entire nail hallux through fifth toenails Ulcer Exam: There is no evidence of ulcer or pre-ulcerative changes or infection. Orthopedic Exam: Muscle tone and strength are WNL. No limitations in general ROM. No crepitus or effusions noted. Foot type and digits show no abnormalities. Bony prominences are unremarkable. Fourth ray resection fourth ray left foot. Skin: No Porokeratosis. No infection or ulcers  Diagnosis:  Onychomycosis, , Pain in right toe, pain in left toes  Treatment & Plan Procedures and Treatment: Consent by patient was obtained for treatment procedures. The patient understood the discussion of treatment and procedures well. All questions were answered thoroughly reviewed. Debridement of mycotic and hypertrophic toenails, 1 through 5 bilateral and clearing of subungual debris. No ulceration, no infection noted.  Return Visit-Office Procedure: Patient instructed to return to the office for a follow up visit 3 months for continued evaluation and treatment.

## 2015-08-31 ENCOUNTER — Ambulatory Visit (INDEPENDENT_AMBULATORY_CARE_PROVIDER_SITE_OTHER): Payer: Federal, State, Local not specified - PPO | Admitting: Podiatry

## 2015-08-31 ENCOUNTER — Ambulatory Visit: Payer: Federal, State, Local not specified - PPO | Admitting: Podiatry

## 2015-08-31 ENCOUNTER — Encounter: Payer: Self-pay | Admitting: Podiatry

## 2015-08-31 DIAGNOSIS — M79673 Pain in unspecified foot: Secondary | ICD-10-CM

## 2015-08-31 DIAGNOSIS — B351 Tinea unguium: Secondary | ICD-10-CM | POA: Diagnosis not present

## 2015-08-31 DIAGNOSIS — M79609 Pain in unspecified limb: Secondary | ICD-10-CM

## 2015-08-31 NOTE — Progress Notes (Signed)
Patient ID: Michael Moore, male   DOB: 07/27/1951, 63 y.o.   MRN: [id number] Complaint:  Visit Type: Patient returns to my office for continued preventative foot care services. Complaint: Patient states" my nails have grown long and thick and become painful to walk and wear shoes" . The patient presents for preventative foot care services. No changes to ROS  Podiatric Exam: Vascular: dorsalis pedis and posterior tibial pulses are palpable bilateral. Capillary return is immediate. Temperature gradient is WNL. Skin turgor WNL  Sensorium: Normal Semmes Weinstein monofilament test. Normal tactile sensation bilaterally. Nail Exam: Pt has thick disfigured discolored nails with subungual debris noted bilateral entire nail hallux through fifth toenails Ulcer Exam: There is no evidence of ulcer or pre-ulcerative changes or infection. Orthopedic Exam: Muscle tone and strength are WNL. No limitations in general ROM. No crepitus or effusions noted. Foot type and digits show no abnormalities. Bony prominences are unremarkable. Fourth ray resection fourth ray left foot. Skin: No Porokeratosis. No infection or ulcers  Diagnosis:  Onychomycosis, , Pain in right toe, pain in left toes  Treatment & Plan Procedures and Treatment: Consent by patient was obtained for treatment procedures. The patient understood the discussion of treatment and procedures well. All questions were answered thoroughly reviewed. Debridement of mycotic and hypertrophic toenails, 1 through 5 bilateral and clearing of subungual debris. No ulceration, no infection noted.  Return Visit-Office Procedure: Patient instructed to return to the office for a follow up visit 3 months for continued evaluation and treatment. 

## 2015-09-02 ENCOUNTER — Ambulatory Visit: Payer: Federal, State, Local not specified - PPO | Admitting: Podiatry

## 2015-11-30 ENCOUNTER — Ambulatory Visit (INDEPENDENT_AMBULATORY_CARE_PROVIDER_SITE_OTHER): Payer: Federal, State, Local not specified - PPO | Admitting: Podiatry

## 2015-11-30 ENCOUNTER — Encounter: Payer: Self-pay | Admitting: Podiatry

## 2015-11-30 DIAGNOSIS — M79673 Pain in unspecified foot: Secondary | ICD-10-CM | POA: Diagnosis not present

## 2015-11-30 DIAGNOSIS — B351 Tinea unguium: Secondary | ICD-10-CM | POA: Diagnosis not present

## 2015-11-30 DIAGNOSIS — M79609 Pain in unspecified limb: Secondary | ICD-10-CM

## 2015-11-30 NOTE — Progress Notes (Signed)
Patient ID: Michael Moore, male   DOB: November 09, 1950, 65 y.o.   MRN: [id number] Complaint:  Visit Type: Patient returns to my office for continued preventative foot care services. Complaint: Patient states" my nails have grown long and thick and become painful to walk and wear shoes" . The patient presents for preventative foot care services. No changes to ROS  Podiatric Exam: Vascular: dorsalis pedis and posterior tibial pulses are palpable bilateral. Capillary return is immediate. Temperature gradient is WNL. Skin turgor WNL  Sensorium: Normal Semmes Weinstein monofilament test. Normal tactile sensation bilaterally. Nail Exam: Pt has thick disfigured discolored nails with subungual debris noted bilateral entire nail hallux through fifth toenails Ulcer Exam: There is no evidence of ulcer or pre-ulcerative changes or infection. Orthopedic Exam: Muscle tone and strength are WNL. No limitations in general ROM. No crepitus or effusions noted. Foot type and digits show no abnormalities. Bony prominences are unremarkable. Fourth ray resection fourth ray left foot. Skin: No Porokeratosis. No infection or ulcers  Diagnosis:  Onychomycosis, , Pain in right toe, pain in left toes  Treatment & Plan Procedures and Treatment: Consent by patient was obtained for treatment procedures. The patient understood the discussion of treatment and procedures well. All questions were answered thoroughly reviewed. Debridement of mycotic and hypertrophic toenails, 1 through 5 bilateral and clearing of subungual debris. No ulceration, no infection noted.  Return Visit-Office Procedure: Patient instructed to return to the office for a follow up visit 3 months for continued evaluation and treatment.   Gardiner Barefoot DPM

## 2016-02-22 ENCOUNTER — Ambulatory Visit: Payer: Federal, State, Local not specified - PPO | Admitting: Podiatry

## 2016-03-21 ENCOUNTER — Ambulatory Visit: Payer: Federal, State, Local not specified - PPO | Admitting: Podiatry

## 2016-04-11 ENCOUNTER — Ambulatory Visit: Payer: Federal, State, Local not specified - PPO | Admitting: Podiatry

## 2016-05-02 ENCOUNTER — Ambulatory Visit: Payer: Federal, State, Local not specified - PPO | Admitting: Podiatry

## 2017-04-11 ENCOUNTER — Ambulatory Visit (INDEPENDENT_AMBULATORY_CARE_PROVIDER_SITE_OTHER): Payer: Federal, State, Local not specified - PPO | Admitting: Orthopedic Surgery

## 2017-04-17 ENCOUNTER — Ambulatory Visit (INDEPENDENT_AMBULATORY_CARE_PROVIDER_SITE_OTHER): Payer: Federal, State, Local not specified - PPO | Admitting: Orthopedic Surgery

## 2017-04-17 ENCOUNTER — Encounter (INDEPENDENT_AMBULATORY_CARE_PROVIDER_SITE_OTHER): Payer: Self-pay | Admitting: Orthopedic Surgery

## 2017-04-17 VITALS — Ht 72.0 in | Wt 232.0 lb

## 2017-04-17 DIAGNOSIS — B351 Tinea unguium: Secondary | ICD-10-CM | POA: Diagnosis not present

## 2017-04-17 DIAGNOSIS — Z96641 Presence of right artificial hip joint: Secondary | ICD-10-CM

## 2017-04-17 NOTE — Progress Notes (Signed)
Office Visit Note   Patient: Michael Moore           Date of Birth: 1951-03-18           MRN: [id number] Visit Date: 04/17/2017              Requested by: Wenda Low, MD [address] [place], [place] [address] PCP: Wenda Low, MD  Chief Complaint  Patient presents with  . Left Foot - Nail Problem      HPI: Patient is a 66 year old gentleman is status post a right total hip arthroplasty states his right hip is giving him no trouble. Patient is concerned with possible nail infection of the left foot. Past medical history negative for diabetes though he has had a history of MRSA infection.  Assessment & Plan: Visit Diagnoses:  1. Onychomycosis   2. Status post total replacement of right hip     Plan: Nails trimmed 10 without complications follow-up in 3 months for reevaluation of both feet.  Follow-Up Instructions: Return in about 3 months (around 07/18/2017).   Ortho Exam  Patient is alert, oriented, no adenopathy, well-dressed, normal affect, normal respiratory effort. Patient has a normal gait. He has a good dorsalis pedis pulse he has thickened disc colored skin changes in the plantar aspect of both feet with insensate calluses. Patient has thick and discolored onychomycotic nails 10 he is unable to safely trim the nails on his own and the nails are trimmed 10 without complications. There was no signs of paronychial infection of signs of abscess no ulcers.  Imaging: No results found.  Labs: No results found for: HGBA1C, ESRSEDRATE, CRP, LABURIC, REPTSTATUS, GRAMSTAIN, CULT, LABORGA  Orders:  No orders of the defined types were placed in this encounter.  No orders of the defined types were placed in this encounter.    Procedures: No procedures performed  Clinical Data: No additional findings.  ROS:  All other systems negative, except as noted in the HPI. Review of Systems  Objective: Vital Signs: Ht 6' (1.829 m)   Wt 232 lb  (105.2 kg)   BMI 31.46 kg/m   Specialty Comments:  No specialty comments available.  PMFS History: Patient Active Problem List   Diagnosis Date Noted  . 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 Essential hypertension 04/13/2014  . History of aortic valve replacement 04/13/2014  . Status post left foot surgery 04/13/2014   Past Medical History:  Diagnosis Date  . Arthritis    right hip  . Dysrhythmia    palpitations  . 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 Hypertension   . Umbilical hernia     Family History  Problem Relation Age of Onset  . Heart attack Mother   . Heart attack Maternal Grandmother   . Heart attack Maternal Grandfather   . Stroke Paternal Grandmother   . Stroke Paternal Grandfather   . Heart attack Brother   . Heart attack Sister     Past Surgical History:  Procedure Laterality Date  . AORTIC ROOT REPLACEMENT  04-07-2007   aortic dissection repair and AVR  . COLONOSCOPY    . FOOT SURGERY  09-03-2005  . HERNIA REPAIR     left in 03/1984; right in 03/1977  . TOTAL HIP ARTHROPLASTY Right 08/25/2014   Procedure: TOTAL HIP ARTHROPLASTY;  Surgeon: Newt Minion, MD;  Location: [facility];  Service: Orthopedics;  Laterality: Right;  Right Total  Hip Arthroplasty   Social History   Occupational History  . Not on file.   Social History Main Topics  . Smoking status: Never Smoker  . Smokeless tobacco: Never Used  . Alcohol use Yes     Comment: wine occasional  . Drug use: No  . Sexual activity: Not on file

## 2017-07-17 ENCOUNTER — Ambulatory Visit (INDEPENDENT_AMBULATORY_CARE_PROVIDER_SITE_OTHER): Payer: Federal, State, Local not specified - PPO | Admitting: Orthopedic Surgery

## 2017-08-07 ENCOUNTER — Ambulatory Visit (INDEPENDENT_AMBULATORY_CARE_PROVIDER_SITE_OTHER): Payer: Federal, State, Local not specified - PPO | Admitting: Orthopedic Surgery

## 2017-09-13 ENCOUNTER — Ambulatory Visit (INDEPENDENT_AMBULATORY_CARE_PROVIDER_SITE_OTHER): Payer: Federal, State, Local not specified - PPO | Admitting: Family

## 2017-11-11 ENCOUNTER — Ambulatory Visit (INDEPENDENT_AMBULATORY_CARE_PROVIDER_SITE_OTHER): Payer: Federal, State, Local not specified - PPO | Admitting: Family

## 2017-11-22 ENCOUNTER — Ambulatory Visit (INDEPENDENT_AMBULATORY_CARE_PROVIDER_SITE_OTHER): Payer: Federal, State, Local not specified - PPO | Admitting: Family

## 2017-12-10 ENCOUNTER — Ambulatory Visit (INDEPENDENT_AMBULATORY_CARE_PROVIDER_SITE_OTHER): Payer: Medicare Other | Admitting: Orthopedic Surgery

## 2017-12-10 ENCOUNTER — Encounter (INDEPENDENT_AMBULATORY_CARE_PROVIDER_SITE_OTHER): Payer: Self-pay | Admitting: Orthopedic Surgery

## 2017-12-10 VITALS — Ht 72.0 in | Wt 232.0 lb

## 2017-12-10 DIAGNOSIS — I87323 Chronic venous hypertension (idiopathic) with inflammation of bilateral lower extremity: Secondary | ICD-10-CM | POA: Diagnosis not present

## 2017-12-10 DIAGNOSIS — B351 Tinea unguium: Secondary | ICD-10-CM

## 2017-12-10 NOTE — Progress Notes (Signed)
Office Visit Note   Patient: Michael Moore           Date of Birth: 1951-03-13           MRN: [id number] Visit Date: 12/10/2017              Requested by: Wenda Low, MD [address] [place], [place] [address] PCP: Wenda Low, MD  Chief Complaint  Patient presents with  . Left Foot - Nail Problem    3 month nail trim  . Right Foot - Nail Problem      HPI: Patient is a 67 year old gentleman who presents with onychomycotic nails as well as venous insufficiency both lower extremities.  He complains of dry cracked skin in both feet thickened discolored nails which is unable to safely trim on his own and venous insufficiency with dark skin color changes.  Assessment & Plan: Visit Diagnoses:  1. Onychomycosis   2. Idiopathic chronic venous hypertension of both lower extremities with inflammation     Plan: Nails were trimmed A35 without complications.  Recommended Dial soap cleansing moisturizing lotion and recommended a medical compression stocking to help decrease the venous swelling and improve circulation in both feet.  Follow-Up Instructions: Return in about 3 months (around 03/09/2018).   Ortho Exam  Patient is alert, oriented, no adenopathy, well-dressed, normal affect, normal respiratory effort. Examination patient has a palpable pulse bilaterally he has brawny skin color changes in both feet and legs.  Pitting edema up to the tibial tubercle.  He has thickened discolored onychomycotic nails x10 he is unable to safely trim the nails on his own and the nails are trimmed T73 without complications.  Imaging: No results found. No images are attached to the encounter.  Labs: No results found for: HGBA1C, ESRSEDRATE, CRP, LABURIC, REPTSTATUS, GRAMSTAIN, CULT, LABORGA  @LABSALLVALUES (HGBA1)@  Body mass index is 31.46 kg/m.  Orders:  No orders of the defined types were placed in this encounter.  No orders of the defined types were placed in this  encounter.    Procedures: No procedures performed  Clinical Data: No additional findings.  ROS:  All other systems negative, except as noted in the HPI. Review of Systems  Objective: Vital Signs: Ht 6' (1.829 m)   Wt 232 lb (105.2 kg)   BMI 31.46 kg/m   Specialty Comments:  No specialty comments available.  PMFS History: Patient Active Problem List   Diagnosis Date Noted  . 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 Essential hypertension 04/13/2014  . History of aortic valve replacement 04/13/2014  . Status post left foot surgery 04/13/2014   Past Medical History:  Diagnosis Date  . Arthritis    right hip  . Dysrhythmia    palpitations  . 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 Hypertension   . Umbilical hernia     Family History  Problem Relation Age of Onset  . Heart attack Mother   . Heart attack Maternal Grandmother   . Heart attack Maternal Grandfather   . Stroke Paternal Grandmother   . Stroke Paternal Grandfather   . Heart attack Brother   . Heart attack Sister     Past Surgical History:  Procedure Laterality Date  . AORTIC ROOT REPLACEMENT  04-07-2007   aortic dissection repair and AVR  . COLONOSCOPY    . FOOT SURGERY  09-03-2005  . HERNIA REPAIR     left in 03/1984; right  in 03/1977  . TOTAL HIP ARTHROPLASTY Right 08/25/2014   Procedure: TOTAL HIP ARTHROPLASTY;  Surgeon: Newt Minion, MD;  Location: [facility];  Service: Orthopedics;  Laterality: Right;  Right Total Hip Arthroplasty   Social History   Occupational History  . Not on file  Tobacco Use  . Smoking status: Never Smoker  . Smokeless tobacco: Never Used  Substance and Sexual Activity  . Alcohol use: Yes    Comment: wine occasional  . Drug use: No  . Sexual activity: Not on file

## 2018-01-24 DIAGNOSIS — I10 Essential (primary) hypertension: Secondary | ICD-10-CM | POA: Diagnosis not present

## 2018-01-24 DIAGNOSIS — Z953 Presence of xenogenic heart valve: Secondary | ICD-10-CM | POA: Diagnosis not present

## 2018-01-24 DIAGNOSIS — I340 Nonrheumatic mitral (valve) insufficiency: Secondary | ICD-10-CM | POA: Diagnosis not present

## 2018-03-11 ENCOUNTER — Ambulatory Visit (INDEPENDENT_AMBULATORY_CARE_PROVIDER_SITE_OTHER): Payer: Medicare Other | Admitting: Orthopedic Surgery

## 2018-03-27 ENCOUNTER — Ambulatory Visit (INDEPENDENT_AMBULATORY_CARE_PROVIDER_SITE_OTHER): Payer: Medicare Other | Admitting: Orthopedic Surgery

## 2018-04-24 ENCOUNTER — Ambulatory Visit (INDEPENDENT_AMBULATORY_CARE_PROVIDER_SITE_OTHER): Payer: Self-pay

## 2018-04-24 ENCOUNTER — Encounter (INDEPENDENT_AMBULATORY_CARE_PROVIDER_SITE_OTHER): Payer: Self-pay | Admitting: Orthopedic Surgery

## 2018-04-24 ENCOUNTER — Ambulatory Visit (INDEPENDENT_AMBULATORY_CARE_PROVIDER_SITE_OTHER): Payer: Medicare Other | Admitting: Orthopedic Surgery

## 2018-04-24 VITALS — Ht 72.0 in | Wt 232.0 lb

## 2018-04-24 DIAGNOSIS — B351 Tinea unguium: Secondary | ICD-10-CM | POA: Diagnosis not present

## 2018-04-24 DIAGNOSIS — I87323 Chronic venous hypertension (idiopathic) with inflammation of bilateral lower extremity: Secondary | ICD-10-CM

## 2018-04-24 DIAGNOSIS — M25551 Pain in right hip: Secondary | ICD-10-CM | POA: Diagnosis not present

## 2018-04-24 NOTE — Progress Notes (Signed)
Office Visit Note   Patient: Michael Moore           Date of Birth: [birth year]           MRN: [id number] Visit Date: 04/24/2018              Requested by: Wenda Low, MD [address] [place], [place] [address] PCP: Wenda Low, MD  Chief Complaint  Patient presents with  . Right Foot - Follow-up  . Left Foot - Follow-up  . Right Hip - Pain      HPI: Patient is a 67 year old gentleman who presents in follow-up for both lower extremities.  He has a history of venous insufficiency status post total hip arthroplasty in the right he states he occasionally has some lateral hip pain.  He states it is worse when he stands for prolonged periods of time denies any back pain or radicular symptoms.  Assessment & Plan: Visit Diagnoses:  1. Pain of right hip joint   2. Onychomycosis   3. Idiopathic chronic venous hypertension of both lower extremities with inflammation     Plan: Nails were trimmed x9 without complications.  Recommended the knee-high compression stockings.  Reevaluation of both feet at follow-up.  Follow-Up Instructions: Return in about 3 months (around 07/25/2018).   Ortho Exam  Patient is alert, oriented, no adenopathy, well-dressed, normal affect, normal respiratory effort. Examination patient has a normal gait.  He does have venous stasis swelling in both legs but no ulcers.  He has thickened discolored onychomycotic nails x9 he is unable to safely trim the nails on his own and the nails are trimmed x9 without complications.  Imaging: No results found. No images are attached to the encounter.  Labs: No results found for: HGBA1C, ESRSEDRATE, CRP, LABURIC, REPTSTATUS, GRAMSTAIN, CULT, LABORGA   Lab Results  Component Value Date   ALBUMIN 3.7 08/11/2014    Body mass index is 31.46 kg/m.  Orders:  Orders Placed This Encounter  Procedures  . XR HIP UNILAT W OR W/O PELVIS 2-3 VIEWS RIGHT   No orders of the defined types were placed in  this encounter.    Procedures: No procedures performed  Clinical Data: No additional findings.  ROS:  All other systems negative, except as noted in the HPI. Review of Systems  Objective: Vital Signs: Ht 6' (1.829 m)   Wt 232 lb (105.2 kg)   BMI 31.46 kg/m   Specialty Comments:  No specialty comments available.  PMFS History: Patient Active Problem List   Diagnosis Date Noted  . 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 Essential hypertension 04/13/2014  . History of aortic valve replacement 04/13/2014  . Status post left foot surgery 04/13/2014   Past Medical History:  Diagnosis Date  . Arthritis    right hip  . Dysrhythmia    palpitations  . 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 Hypertension   . Umbilical hernia     Family History  Problem Relation Age of Onset  . Heart attack Mother   . Heart attack Maternal Grandmother   . Heart attack Maternal Grandfather   . Stroke Paternal Grandmother   . Stroke Paternal Grandfather   . Heart attack Brother   . Heart attack Sister     Past Surgical History:  Procedure Laterality Date  . AORTIC ROOT REPLACEMENT  04-07-2007   aortic dissection repair and AVR  . COLONOSCOPY    .  FOOT SURGERY  09-03-2005  . HERNIA REPAIR     left in 03/1984; right in 03/1977  . TOTAL HIP ARTHROPLASTY Right 08/25/2014   Procedure: TOTAL HIP ARTHROPLASTY;  Surgeon: Newt Minion, MD;  Location: [facility];  Service: Orthopedics;  Laterality: Right;  Right Total Hip Arthroplasty   Social History   Occupational History  . Not on file  Tobacco Use  . Smoking status: Never Smoker  . Smokeless tobacco: Never Used  Substance and Sexual Activity  . Alcohol use: Yes    Comment: wine occasional  . Drug use: No  . Sexual activity: Not on file

## 2018-07-28 ENCOUNTER — Ambulatory Visit (INDEPENDENT_AMBULATORY_CARE_PROVIDER_SITE_OTHER): Payer: Medicare Other | Admitting: Orthopedic Surgery

## 2018-08-12 ENCOUNTER — Ambulatory Visit (INDEPENDENT_AMBULATORY_CARE_PROVIDER_SITE_OTHER): Payer: Medicare Other | Admitting: Orthopedic Surgery

## 2018-08-28 ENCOUNTER — Ambulatory Visit (INDEPENDENT_AMBULATORY_CARE_PROVIDER_SITE_OTHER): Payer: Medicare Other | Admitting: Orthopedic Surgery

## 2018-09-10 ENCOUNTER — Encounter (INDEPENDENT_AMBULATORY_CARE_PROVIDER_SITE_OTHER): Payer: Self-pay | Admitting: Orthopedic Surgery

## 2018-09-10 ENCOUNTER — Ambulatory Visit (INDEPENDENT_AMBULATORY_CARE_PROVIDER_SITE_OTHER): Payer: Medicare Other | Admitting: Orthopedic Surgery

## 2018-09-10 VITALS — Ht 72.0 in | Wt 232.0 lb

## 2018-09-10 DIAGNOSIS — I87323 Chronic venous hypertension (idiopathic) with inflammation of bilateral lower extremity: Secondary | ICD-10-CM | POA: Diagnosis not present

## 2018-09-10 DIAGNOSIS — Z96641 Presence of right artificial hip joint: Secondary | ICD-10-CM | POA: Diagnosis not present

## 2018-09-10 DIAGNOSIS — B351 Tinea unguium: Secondary | ICD-10-CM

## 2018-09-17 ENCOUNTER — Encounter (INDEPENDENT_AMBULATORY_CARE_PROVIDER_SITE_OTHER): Payer: Self-pay | Admitting: Orthopedic Surgery

## 2018-09-17 NOTE — Progress Notes (Signed)
Office Visit Note   Patient: Michael Moore           Date of Birth: [birth year]           MRN: [id number] Visit Date: 09/10/2018              Requested by: Wenda Low, MD [address] [place], [place] [address] PCP: Wenda Low, MD  Chief Complaint  Patient presents with  . Left Foot - Nail Problem  . Right Foot - Nail Problem      HPI: Patient is a 67 year old gentleman who presents for evaluation of both feet.  He has a history of venous insufficiency status post total hip arthroplasty 4 years ago and denies any hip pain at this time.  Assessment & Plan: Visit Diagnoses:  1. Onychomycosis   2. Idiopathic chronic venous hypertension of both lower extremities with inflammation   3. Status post total replacement of right hip     Plan: Nails were trimmed x9 without complications.  Recommended compression for the venous insufficiency.  Follow-Up Instructions: Return in about 3 months (around 12/11/2018).   Ortho Exam  Patient is alert, oriented, no adenopathy, well-dressed, normal affect, normal respiratory effort. Examination patient has venous insufficiency both lower extremities but no open ulcers.  He has thickened discolored onychomycotic nails x9 he is unable to safely trim the nails on his own and the nails were trimmed x9 without complications.  Imaging: No results found. No images are attached to the encounter.  Labs: No results found for: HGBA1C, ESRSEDRATE, CRP, LABURIC, REPTSTATUS, GRAMSTAIN, CULT, LABORGA   Lab Results  Component Value Date   ALBUMIN 3.7 08/11/2014    Body mass index is 31.46 kg/m.  Orders:  No orders of the defined types were placed in this encounter.  No orders of the defined types were placed in this encounter.    Procedures: No procedures performed  Clinical Data: No additional findings.  ROS:  All other systems negative, except as noted in the HPI. Review of Systems  Objective: Vital Signs:  Ht 6' (1.829 m)   Wt 232 lb (105.2 kg)   BMI 31.46 kg/m   Specialty Comments:  No specialty comments available.  PMFS History: Patient Active Problem List   Diagnosis Date Noted  . 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 Essential hypertension 04/13/2014  . History of aortic valve replacement 04/13/2014  . Status post left foot surgery 04/13/2014   Past Medical History:  Diagnosis Date  . Arthritis    right hip  . Dysrhythmia    palpitations  . 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 Hypertension   . Umbilical hernia     Family History  Problem Relation Age of Onset  . Heart attack Mother   . Heart attack Maternal Grandmother   . Heart attack Maternal Grandfather   . Stroke Paternal Grandmother   . Stroke Paternal Grandfather   . Heart attack Brother   . Heart attack Sister     Past Surgical History:  Procedure Laterality Date  . AORTIC ROOT REPLACEMENT  04-07-2007   aortic dissection repair and AVR  . COLONOSCOPY    . FOOT SURGERY  09-03-2005  . HERNIA REPAIR     left in 03/1984; right in 03/1977  . TOTAL HIP ARTHROPLASTY Right 08/25/2014   Procedure: TOTAL HIP ARTHROPLASTY;  Surgeon: Newt Minion, MD;  Location: [facility];  Service: Orthopedics;  Laterality: Right;  Right Total Hip Arthroplasty   Social History   Occupational History  . Not on file  Tobacco Use  . Smoking status: Never Smoker  . Smokeless tobacco: Never Used  Substance and Sexual Activity  . Alcohol use: Yes    Comment: wine occasional  . Drug use: No  . Sexual activity: Not on file

## 2018-10-03 DIAGNOSIS — R972 Elevated prostate specific antigen [PSA]: Secondary | ICD-10-CM | POA: Diagnosis not present

## 2018-10-03 DIAGNOSIS — Z6837 Body mass index (BMI) 37.0-37.9, adult: Secondary | ICD-10-CM | POA: Diagnosis not present

## 2018-10-03 DIAGNOSIS — E782 Mixed hyperlipidemia: Secondary | ICD-10-CM | POA: Diagnosis not present

## 2018-10-03 DIAGNOSIS — Z23 Encounter for immunization: Secondary | ICD-10-CM | POA: Diagnosis not present

## 2018-10-03 DIAGNOSIS — I10 Essential (primary) hypertension: Secondary | ICD-10-CM | POA: Diagnosis not present

## 2018-10-03 DIAGNOSIS — Z8679 Personal history of other diseases of the circulatory system: Secondary | ICD-10-CM | POA: Diagnosis not present

## 2018-10-03 DIAGNOSIS — J309 Allergic rhinitis, unspecified: Secondary | ICD-10-CM | POA: Diagnosis not present

## 2018-10-03 DIAGNOSIS — Z0000 Encounter for general adult medical examination without abnormal findings: Secondary | ICD-10-CM | POA: Diagnosis not present

## 2018-11-19 DIAGNOSIS — N5201 Erectile dysfunction due to arterial insufficiency: Secondary | ICD-10-CM | POA: Diagnosis not present

## 2018-11-19 DIAGNOSIS — R972 Elevated prostate specific antigen [PSA]: Secondary | ICD-10-CM | POA: Diagnosis not present

## 2018-11-19 DIAGNOSIS — N400 Enlarged prostate without lower urinary tract symptoms: Secondary | ICD-10-CM | POA: Diagnosis not present

## 2018-11-21 ENCOUNTER — Other Ambulatory Visit (HOSPITAL_COMMUNITY): Payer: Self-pay | Admitting: Urology

## 2018-11-21 ENCOUNTER — Other Ambulatory Visit: Payer: Self-pay | Admitting: Urology

## 2018-11-21 DIAGNOSIS — R972 Elevated prostate specific antigen [PSA]: Secondary | ICD-10-CM

## 2018-12-10 ENCOUNTER — Encounter (INDEPENDENT_AMBULATORY_CARE_PROVIDER_SITE_OTHER): Payer: Self-pay | Admitting: Orthopedic Surgery

## 2018-12-10 ENCOUNTER — Ambulatory Visit (INDEPENDENT_AMBULATORY_CARE_PROVIDER_SITE_OTHER): Payer: Medicare Other | Admitting: Orthopedic Surgery

## 2018-12-10 VITALS — Ht 72.0 in | Wt 232.0 lb

## 2018-12-10 DIAGNOSIS — B351 Tinea unguium: Secondary | ICD-10-CM

## 2018-12-11 ENCOUNTER — Ambulatory Visit (HOSPITAL_COMMUNITY): Payer: Federal, State, Local not specified - PPO

## 2018-12-11 ENCOUNTER — Encounter (INDEPENDENT_AMBULATORY_CARE_PROVIDER_SITE_OTHER): Payer: Self-pay | Admitting: Orthopedic Surgery

## 2018-12-11 NOTE — Progress Notes (Signed)
Office Visit Note   Patient: Michael Moore           Date of Birth: 21-Jul-1951           MRN: [id number] Visit Date: 12/10/2018              Requested by: Wenda Low, MD [address] [place], [place] [address] PCP: Wenda Low, MD  Chief Complaint  Patient presents with  . Left Foot - Follow-up  . Right Foot - Follow-up      HPI: Patient is a 68 year old gentleman who presents for evaluation of both feet.  He has a history of venous insufficiency status post total hip arthroplasty 4 years ago and denies any hip pain at this time.  New callused area to the fourth toe on the left foot.  No pain associated.  No drainage.  Assessment & Plan: Visit Diagnoses:  1. Onychomycosis     Plan: Nails were trimmed x9 without complications.  Callus pared of the 10 blade knife.  Patient tolerated well.  Recommended compression for the venous insufficiency.  Follow-Up Instructions: Return in about 3 months (around 03/10/2019).   Ortho Exam  Patient is alert, oriented, no adenopathy, well-dressed, normal affect, normal respiratory effort. Examination patient has venous insufficiency both lower extremities but no open ulcers.  He has thickened discolored onychomycotic nails x9 he is unable to safely trim the nails on his own and the nails were trimmed x9 without complications.  Callused ulceration to the plantar aspect of his fourth toe on the left foot this is 5 mm in diameter 4 mm thick was part of the 10 blade knife back to viable tissue there is no bleeding no complications.  Imaging: No results found. No images are attached to the encounter.  Labs: No results found for: HGBA1C, ESRSEDRATE, CRP, LABURIC, REPTSTATUS, GRAMSTAIN, CULT, LABORGA   Lab Results  Component Value Date   ALBUMIN 3.7 08/11/2014    Body mass index is 31.46 kg/m.  Orders:  No orders of the defined types were placed in this encounter.  No orders of the defined types were placed in  this encounter.    Procedures: No procedures performed  Clinical Data: No additional findings.  ROS:  All other systems negative, except as noted in the HPI. Review of Systems  Constitutional: Negative for chills and fever.  Skin: Negative for color change and wound.    Objective: Vital Signs: Ht 6' (1.829 m)   Wt 232 lb (105.2 kg)   BMI 31.46 kg/m   Specialty Comments:  No specialty comments available.  PMFS History: Patient Active Problem List   Diagnosis Date Noted  . 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 Essential hypertension 04/13/2014  . History of aortic valve replacement 04/13/2014  . Status post left foot surgery 04/13/2014   Past Medical History:  Diagnosis Date  . Arthritis    right hip  . Dysrhythmia    palpitations  . 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 Hypertension   . Umbilical hernia     Family History  Problem Relation Age of Onset  . Heart attack Mother   . Heart attack Maternal Grandmother   . Heart attack Maternal Grandfather   . Stroke Paternal Grandmother   . Stroke Paternal Grandfather   . Heart attack Brother   . Heart attack Sister     Past Surgical History:  Procedure Laterality Date  .  AORTIC ROOT REPLACEMENT  04-07-2007   aortic dissection repair and AVR  . COLONOSCOPY    . FOOT SURGERY  09-03-2005  . HERNIA REPAIR     left in 03/1984; right in 03/1977  . TOTAL HIP ARTHROPLASTY Right 08/25/2014   Procedure: TOTAL HIP ARTHROPLASTY;  Surgeon: Newt Minion, MD;  Location: [facility];  Service: Orthopedics;  Laterality: Right;  Right Total Hip Arthroplasty   Social History   Occupational History  . Not on file  Tobacco Use  . Smoking status: Never Smoker  . Smokeless tobacco: Never Used  Substance and Sexual Activity  . Alcohol use: Yes    Comment: wine occasional  . Drug use: No  . Sexual activity: Not on file

## 2018-12-18 ENCOUNTER — Ambulatory Visit (HOSPITAL_COMMUNITY)
Admission: RE | Admit: 2018-12-18 | Discharge: 2018-12-18 | Disposition: A | Discharge status: Home / Self Care | Payer: Medicare Other | Source: Ambulatory Visit | Attending: Urology | Admitting: Urology

## 2018-12-18 ENCOUNTER — Encounter (HOSPITAL_COMMUNITY): Payer: Self-pay

## 2018-12-18 DIAGNOSIS — R972 Elevated prostate specific antigen [PSA]: Secondary | ICD-10-CM

## 2018-12-18 LAB — POCT I-STAT CREATININE: CREATININE: 1.1 mg/dL (ref 0.61–1.24)

## 2018-12-18 IMAGING — MR MR PROSTATE WO/W CM
1 series · 30 of 30 positions shown · IV contrast (gadavist)
Comparison: None.

CLINICAL DATA: Elevated PSA level, 13.0 ng/mL on 11/19/2018. Prior
biopsy on 02/16/2011 was negative.

EXAM:
MR PROSTATE WITHOUT AND WITH CONTRAST
TECHNIQUE: Multiplanar multisequence MRI images were obtained of the pelvis
centered about the prostate. Pre and post contrast images were
obtained.
CONTRAST:  10 cc Gadavist

[Series 1100: DWI · axial · 3.0mm · 0.59mm/px · z∈[-36,+65]mm · 30 of 30 slices shown]
[im 1/30]
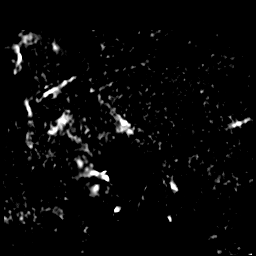
[im 2/30]
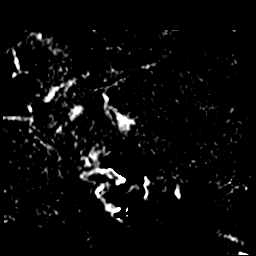
[im 3/30]
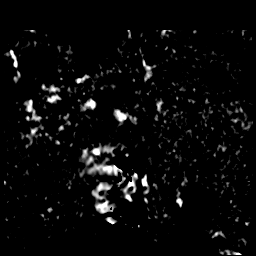
[im 4/30]
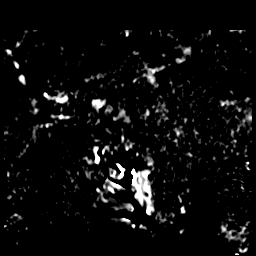
[im 5/30]
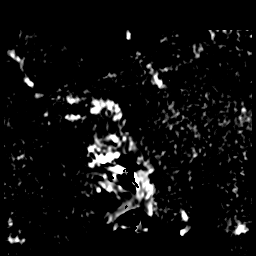
[im 6/30]
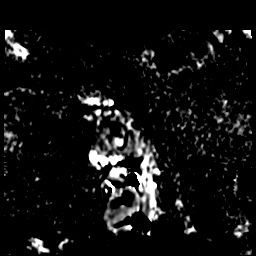
[im 7/30]
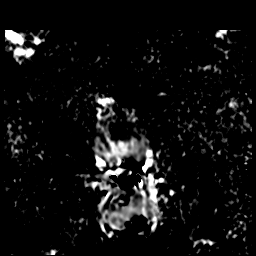
[im 8/30]
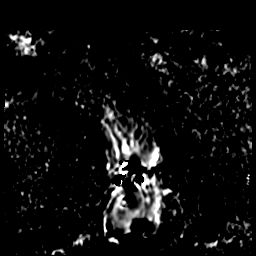
[im 9/30]
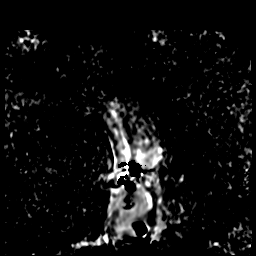
[im 10/30]
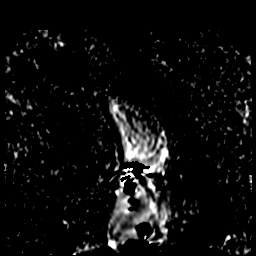
[im 11/30]
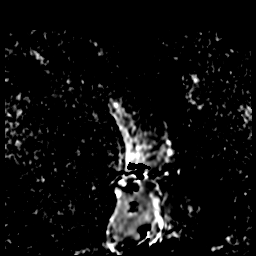
[im 12/30]
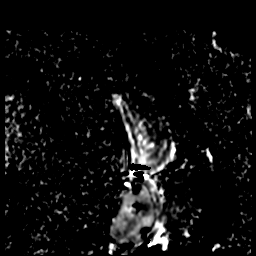
[im 13/30]
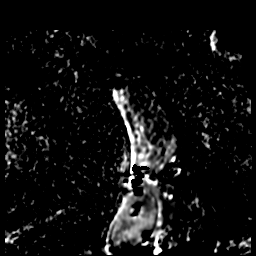
[im 14/30]
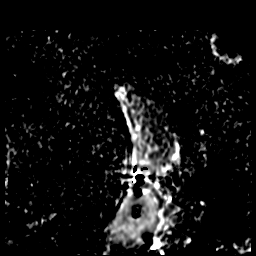
[im 15/30]
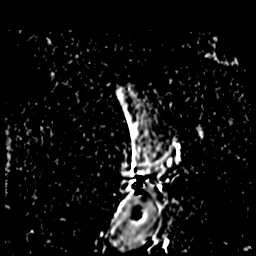
[im 16/30]
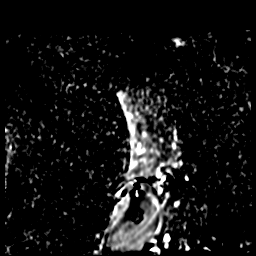
[im 17/30]
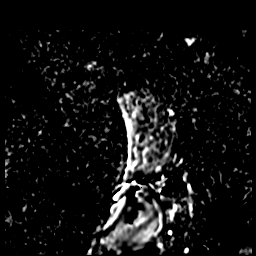
[im 18/30]
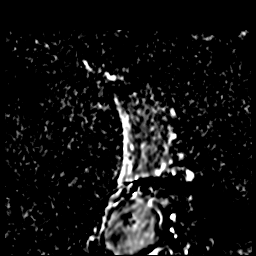
[im 19/30]
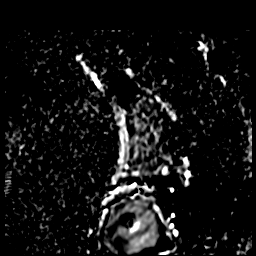
[im 20/30]
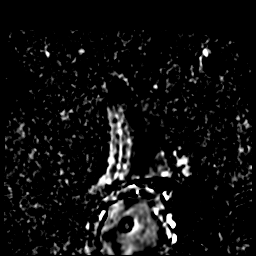
[im 21/30]
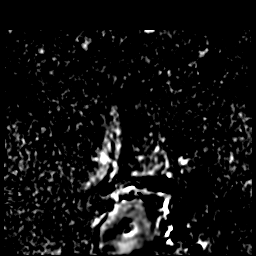
[im 22/30]
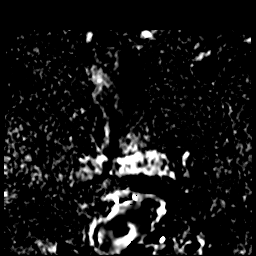
[im 23/30]
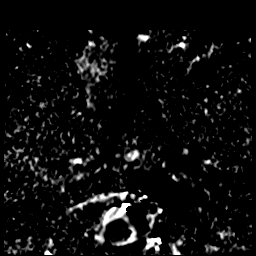
[im 24/30]
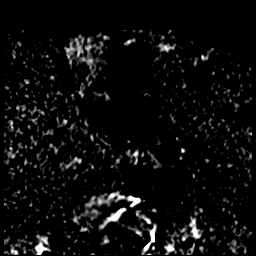
[im 25/30]
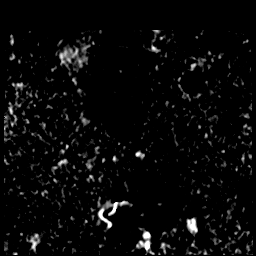
[im 26/30]
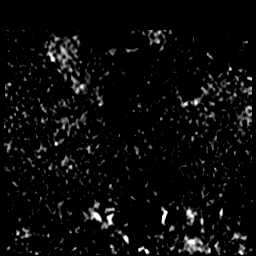
[im 27/30]
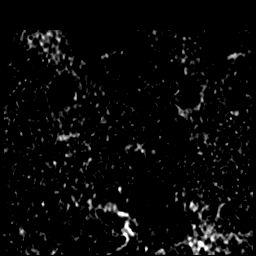
[im 28/30]
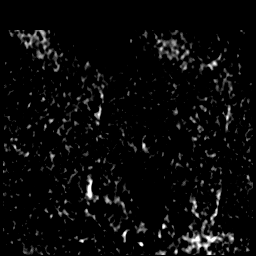
[im 29/30]
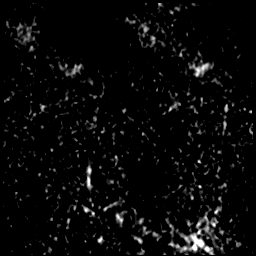
[im 30/30]
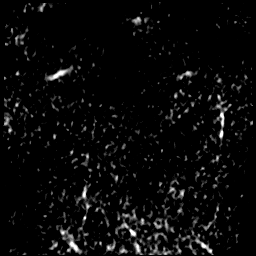

[30 of 30 positions shown; findings below may reference images not displayed]

FINDINGS: Prostate: The patient has a right hip implant which causes
considerable distortion of the right hemipelvis. As result, the
postcontrast, ADC map, and diffusion weighted images are essentially
nondiagnostic. Because these series are not diagnostic due to the
hip implant, traditional PI-RADS categories could not be applied.

The bulk of today's exam rests on the T2 weighted images. Please
note that due to image distortion from the hip implant, I can not
guarantee that mapping from the prostate gland from the MRI well
match other modalities such as ultrasound.

In the right anterior and posterior peripheral zone of the prostate
apex, region of interest # 1 measures 1.11 cubic cm (1.9 by 1.2 by
1.4 cm) and demonstrates low T2 signal. Enhancement, ADC map
activity, and diffusion weighted activity indeterminate.

Volume: 3D volumetric analysis: Prostate volume 126.80 cubic cm (6.3
by 5.2 by 7.6 cm).

Transcapsular spread:  Absent

Seminal vesicle involvement: Absent

Neurovascular bundle involvement: Absent

Pelvic adenopathy: Absent

Bone metastasis: Absent

Other findings: Right hip prosthesis.
IMPRESSION: 1. The right hip implant severely distorts multiple critical series
including the postcontrast images, ADC map, and diffusion-weighted
images. Because of this, traditional PI-RADS categories could not be
applied.
2. There is 1.11 cubic cm low T2 signal lesion in the right anterior
and posterior peripheral zone at the apex. Indeterminate enhancement
and diffusion weighted characteristics. This lesion was designated
region of interest # 1 for UroNAV targeting purposes.
3. Benign prostatic hypertrophy with considerable prostatomegaly,
prostate volume 126.80 cubic cm.

## 2018-12-18 MED ORDER — GADOBUTROL 1 MMOL/ML IV SOLN
20.0000 mL | Freq: Once | INTRAVENOUS | Status: AC | PRN
  Administered 2018-12-18: 10 mL via INTRAVENOUS

## 2019-03-11 ENCOUNTER — Ambulatory Visit (INDEPENDENT_AMBULATORY_CARE_PROVIDER_SITE_OTHER): Payer: Medicare Other | Admitting: Orthopedic Surgery

## 2019-03-11 DIAGNOSIS — R972 Elevated prostate specific antigen [PSA]: Secondary | ICD-10-CM | POA: Diagnosis not present

## 2019-03-11 DIAGNOSIS — N400 Enlarged prostate without lower urinary tract symptoms: Secondary | ICD-10-CM | POA: Diagnosis not present

## 2019-03-12 ENCOUNTER — Ambulatory Visit: Payer: Self-pay | Admitting: Orthopedic Surgery

## 2019-03-13 DIAGNOSIS — I351 Nonrheumatic aortic (valve) insufficiency: Secondary | ICD-10-CM | POA: Diagnosis not present

## 2019-03-13 DIAGNOSIS — I10 Essential (primary) hypertension: Secondary | ICD-10-CM | POA: Diagnosis not present

## 2019-03-13 DIAGNOSIS — Z8679 Personal history of other diseases of the circulatory system: Secondary | ICD-10-CM | POA: Diagnosis not present

## 2019-03-13 DIAGNOSIS — Z953 Presence of xenogenic heart valve: Secondary | ICD-10-CM | POA: Diagnosis not present

## 2019-03-17 ENCOUNTER — Ambulatory Visit: Payer: Federal, State, Local not specified - PPO | Admitting: Orthopedic Surgery

## 2019-04-01 ENCOUNTER — Encounter: Payer: Self-pay | Admitting: Orthopedic Surgery

## 2019-04-01 ENCOUNTER — Ambulatory Visit (INDEPENDENT_AMBULATORY_CARE_PROVIDER_SITE_OTHER): Payer: Medicare Other | Admitting: Family

## 2019-04-01 ENCOUNTER — Ambulatory Visit (INDEPENDENT_AMBULATORY_CARE_PROVIDER_SITE_OTHER): Payer: Medicare Other

## 2019-04-01 ENCOUNTER — Other Ambulatory Visit: Payer: Self-pay

## 2019-04-01 VITALS — Ht 72.0 in | Wt 232.0 lb

## 2019-04-01 DIAGNOSIS — M25552 Pain in left hip: Secondary | ICD-10-CM

## 2019-04-02 DIAGNOSIS — M25552 Pain in left hip: Secondary | ICD-10-CM

## 2019-04-02 MED ORDER — METHYLPREDNISOLONE ACETATE 40 MG/ML IJ SUSP
40.0000 mg | INTRAMUSCULAR | Status: AC | PRN
  Administered 2019-04-02: 15:00:00 40 mg via INTRA_ARTICULAR

## 2019-04-02 MED ORDER — LIDOCAINE HCL 1 % IJ SOLN
5.0000 mL | INTRAMUSCULAR | Status: AC | PRN
  Administered 2019-04-02: 15:00:00 5 mL

## 2019-04-02 MED ORDER — METHYLPREDNISOLONE ACETATE 40 MG/ML IJ SUSP
40.0000 mg | INTRAMUSCULAR | Status: AC | PRN
  Administered 2019-04-02: 40 mg via INTRA_ARTICULAR

## 2019-04-02 NOTE — Progress Notes (Signed)
Office Visit Note   Patient: Michael Moore           Date of Birth: December 11, 1950           MRN: [id number] Visit Date: 04/01/2019              Requested by: Wenda Low, MD [address] [place], [place] [address] PCP: Wenda Low, MD  Chief Complaint  Patient presents with  . Left Hip - Pain      HPI: The patient is a 68 year old gentleman who presents today complaining of excruciating left hip pain.  Points to the lateral aspect of his hip.  This is been ongoing for 2 days now.  He denies any groin pain no radicular symptoms no back pain no injuries no difficulty with ambulation.  Does have a history of a total hip replacement on the right.  Requesting a radiograph of his hip. Taking anti-inflammatories without any relief. Assessment & Plan: Visit Diagnoses:  1. Pain in left hip     Plan: Patient feels strongly for cortisone injection in the left hip.  Depo-Medrol injection for bursitis.  Follow-up in the office to be cyst in 3 to 4 weeks.  Discussed anti-inflammatories and stretching  Follow-Up Instructions: No follow-ups on file.   Left Hip Exam   Tenderness  The patient is experiencing tenderness in the greater trochanter.  Range of Motion  The patient has normal left hip ROM.  Muscle Strength  The patient has normal left hip strength.   Tests  FABER: negative  Other  Erythema: absent Sensation: normal      Patient is alert, oriented, no adenopathy, well-dressed, normal affect, normal respiratory effort.   Imaging: No results found. No images are attached to the encounter.  Labs: No results found for: HGBA1C, ESRSEDRATE, CRP, LABURIC, REPTSTATUS, GRAMSTAIN, CULT, LABORGA   Lab Results  Component Value Date   ALBUMIN 3.7 08/11/2014    Body mass index is 31.46 kg/m.  Orders:  Orders Placed This Encounter  Procedures  . XR HIP UNILAT W OR W/O PELVIS 2-3 VIEWS LEFT   No orders of the defined types were placed in this  encounter.    Procedures: Large Joint Inj: bilateral greater trochanter on 04/02/2019 2:54 PM Indications: pain Details: 22 G 1.5 in needle, lateral approach Medications (Right): 5 mL lidocaine 1 %; 40 mg methylPREDNISolone acetate 40 MG/ML Medications (Left): 5 mL lidocaine 1 %; 40 mg methylPREDNISolone acetate 40 MG/ML Outcome: tolerated well, no immediate complications Consent was given by the patient.      Clinical Data: No additional findings.  ROS:  All other systems negative, except as noted in the HPI. Review of Systems  Constitutional: Negative for chills and fever.  Musculoskeletal: Positive for arthralgias and myalgias. Negative for gait problem.  Skin: Negative for color change.    Objective: Vital Signs: Ht 6' (1.829 m)   Wt 232 lb (105.2 kg)   BMI 31.46 kg/m   Specialty Comments:  No specialty comments available.  PMFS History: Patient Active Problem List   Diagnosis Date Noted  . 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 Essential hypertension 04/13/2014  . History of aortic valve replacement 04/13/2014  . Status post left foot surgery 04/13/2014   Past Medical History:  Diagnosis Date  . Arthritis    right hip  . Dysrhythmia    palpitations  . 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  Hypertension   . Umbilical hernia     Family History  Problem Relation Age of Onset  . Heart attack Mother   . Heart attack Maternal Grandmother   . Heart attack Maternal Grandfather   . Stroke Paternal Grandmother   . Stroke Paternal Grandfather   . Heart attack Brother   . Heart attack Sister     Past Surgical History:  Procedure Laterality Date  . AORTIC ROOT REPLACEMENT  04-07-2007   aortic dissection repair and AVR  . COLONOSCOPY    . FOOT SURGERY  09-03-2005  . HERNIA REPAIR     left in 03/1984; right in 03/1977  . TOTAL HIP ARTHROPLASTY Right 08/25/2014   Procedure: TOTAL HIP ARTHROPLASTY;  Surgeon: Newt Minion, MD;   Location: [facility];  Service: Orthopedics;  Laterality: Right;  Right Total Hip Arthroplasty   Social History   Occupational History  . Not on file  Tobacco Use  . Smoking status: Never Smoker  . Smokeless tobacco: Never Used  Substance and Sexual Activity  . Alcohol use: Yes    Comment: wine occasional  . Drug use: No  . Sexual activity: Not on file

## 2019-04-09 ENCOUNTER — Other Ambulatory Visit: Payer: Self-pay

## 2019-04-09 ENCOUNTER — Encounter: Payer: Self-pay | Admitting: Orthopedic Surgery

## 2019-04-09 ENCOUNTER — Ambulatory Visit (INDEPENDENT_AMBULATORY_CARE_PROVIDER_SITE_OTHER): Payer: Medicare Other | Admitting: Physician Assistant

## 2019-04-09 VITALS — Ht 72.0 in | Wt 232.0 lb

## 2019-04-09 DIAGNOSIS — B351 Tinea unguium: Secondary | ICD-10-CM | POA: Diagnosis not present

## 2019-04-09 DIAGNOSIS — M79671 Pain in right foot: Secondary | ICD-10-CM | POA: Diagnosis not present

## 2019-04-09 DIAGNOSIS — M79672 Pain in left foot: Secondary | ICD-10-CM

## 2019-04-10 ENCOUNTER — Encounter: Payer: Self-pay | Admitting: Physician Assistant

## 2019-04-10 NOTE — Progress Notes (Signed)
Office Visit Note   Patient: Michael Moore           Date of Birth: 02/06/1951           MRN: [id number] Visit Date: 04/09/2019              Requested by: Wenda Low, MD [address] [place] [place],  [place] [address] PCP: Wenda Low, MD  Chief Complaint  Patient presents with  . Left Hip - Follow-up      HPI: The patient is a 68 year old gentleman who is seen for onychomycosis of bilateral toenails.  He presents today for bilateral toenail trim as he cannot safely complete this at home.  He reports his nails have become long, they continue to be quite thick and are painful when he walks or wear shoes.   He is status post a left hip injection last week and reports that this helped significantly and he is feeling much better following the injection to his hip.  Assessment & Plan: Visit Diagnoses:  1. Onychomycosis     Plan: The patient underwent nail trim x9 of bilateral feet due to severe onychomycosis with inability to safely trim his nails at home.  He tolerated this well.  Plan he will follow-up here in 3 months.  Follow-Up Instructions: Return in about 3 months (around 07/10/2019).   Ortho Exam  Patient is alert, oriented, no adenopathy, well-dressed, normal affect, normal respiratory effort. Patient has palpable pedal pulses bilaterally.  He has no ulceration or other skin breakdown over both feet.  He has discolored thickened onychomycotic nails bilateral toenails were trimmed x9 without difficulty and the patient tolerated this well. He has palpable pedal pulses.  He has a left foot fourth ray remote amputation and the site remains well-healed. Imaging: No results found. No images are attached to the encounter.  Labs: No results found for: HGBA1C, ESRSEDRATE, CRP, LABURIC, REPTSTATUS, GRAMSTAIN, CULT, LABORGA   Lab Results  Component Value Date   ALBUMIN 3.7 08/11/2014    Body mass index is 31.46 kg/m.  Orders:  No orders of the defined  types were placed in this encounter.  No orders of the defined types were placed in this encounter.    Procedures: No procedures performed  Clinical Data: No additional findings.  ROS:  All other systems negative, except as noted in the HPI. Review of Systems  Objective: Vital Signs: Ht 6' (1.829 m)   Wt 232 lb (105.2 kg)   BMI 31.46 kg/m   Specialty Comments:  No specialty comments available.  PMFS History: Patient Active Problem List   Diagnosis Date Noted  . 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 Essential hypertension 04/13/2014  . History of aortic valve replacement 04/13/2014  . Status post left foot surgery 04/13/2014   Past Medical History:  Diagnosis Date  . Arthritis    right hip  . Dysrhythmia    palpitations  . 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 Hypertension   . Umbilical hernia     Family History  Problem Relation Age of Onset  . Heart attack Mother   . Heart attack Maternal Grandmother   . Heart attack Maternal Grandfather   . Stroke Paternal Grandmother   . Stroke Paternal Grandfather   . Heart attack Brother   . Heart attack Sister     Past Surgical History:  Procedure Laterality Date  . AORTIC ROOT REPLACEMENT  04-07-2007  aortic dissection repair and AVR  . COLONOSCOPY    . FOOT SURGERY  09-03-2005  . HERNIA REPAIR     left in 03/1984; right in 03/1977  . TOTAL HIP ARTHROPLASTY Right 08/25/2014   Procedure: TOTAL HIP ARTHROPLASTY;  Surgeon: Newt Minion, MD;  Location: [facility];  Service: Orthopedics;  Laterality: Right;  Right Total Hip Arthroplasty   Social History   Occupational History  . Not on file  Tobacco Use  . Smoking status: Never Smoker  . Smokeless tobacco: Never Used  Substance and Sexual Activity  . Alcohol use: Yes    Comment: wine occasional  . Drug use: No  . Sexual activity: Not on file

## 2019-05-20 DIAGNOSIS — N4289 Other specified disorders of prostate: Secondary | ICD-10-CM | POA: Diagnosis not present

## 2019-05-20 DIAGNOSIS — R972 Elevated prostate specific antigen [PSA]: Secondary | ICD-10-CM | POA: Diagnosis not present

## 2019-07-13 ENCOUNTER — Ambulatory Visit: Payer: Federal, State, Local not specified - PPO | Admitting: Orthopedic Surgery

## 2019-07-23 ENCOUNTER — Encounter: Payer: Self-pay | Admitting: Orthopedic Surgery

## 2019-07-23 ENCOUNTER — Ambulatory Visit (INDEPENDENT_AMBULATORY_CARE_PROVIDER_SITE_OTHER): Payer: Medicare Other | Admitting: Orthopedic Surgery

## 2019-07-23 VITALS — Ht 72.0 in | Wt 232.0 lb

## 2019-07-23 DIAGNOSIS — B351 Tinea unguium: Secondary | ICD-10-CM | POA: Diagnosis not present

## 2019-07-23 DIAGNOSIS — I87323 Chronic venous hypertension (idiopathic) with inflammation of bilateral lower extremity: Secondary | ICD-10-CM

## 2019-07-28 ENCOUNTER — Encounter: Payer: Self-pay | Admitting: Orthopedic Surgery

## 2019-07-28 NOTE — Progress Notes (Signed)
Office Visit Note   Patient: Michael Moore           Date of Birth: [birth year]           MRN: [id number] Visit Date: 07/23/2019              Requested by: Wenda Low, MD [address] [place] [place],  [place] [address] PCP: Wenda Low, MD  Chief Complaint  Patient presents with  . Right Foot - Follow-up    Bilateral foot nail trim   . Left Foot - Follow-up      HPI: Patient is a 68 year old gentleman who is seen in follow-up for bilateral lower extremities.  Patient states he has no complaints or concerns at this time complains of painful onychomycotic nails x9.  Assessment & Plan: Visit Diagnoses:  1. Onychomycosis   2. Idiopathic chronic venous hypertension of both lower extremities with inflammation     Plan: Nails were trimmed x9 without complications no venous ulcers reevaluate in 3 months.  Follow-Up Instructions: Return in about 3 months (around 10/22/2019).   Ortho Exam  Patient is alert, oriented, no adenopathy, well-dressed, normal affect, normal respiratory effort. Examination patient has no plantar ulcers no venous ulcers.  There is venous stasis swelling he has no paronychial nail infection he does have thickened discolored onychomycotic nails x9 he is unable to safely trim the nails on his own and nails were trimmed x9 without complications.  Imaging: No results found. No images are attached to the encounter.  Labs: No results found for: HGBA1C, ESRSEDRATE, CRP, LABURIC, REPTSTATUS, GRAMSTAIN, CULT, LABORGA   Lab Results  Component Value Date   ALBUMIN 3.7 08/11/2014    No results found for: MG No results found for: VD25OH  No results found for: PREALBUMIN CBC EXTENDED Latest Ref Rng & Units 08/27/2014 08/26/2014 08/11/2014  WBC 4.0 - 10.5 K/uL 7.7 8.7 6.6  RBC 4.22 - 5.81 MIL/uL 3.90(L) 3.86(L) 4.94  HGB 13.0 - 17.0 g/dL 12.0(L) 12.0(L) 15.3  HCT 39.0 - 52.0 % 35.7(L) 34.9(L) 43.7  PLT 150 - 400 K/uL 178 179 195  NEUTROABS -  - - -  LYMPHSABS - - - -     Body mass index is 31.46 kg/m.  Orders:  No orders of the defined types were placed in this encounter.  No orders of the defined types were placed in this encounter.    Procedures: No procedures performed  Clinical Data: No additional findings.  ROS:  All other systems negative, except as noted in the HPI. Review of Systems  Objective: Vital Signs: Ht 6' (1.829 m)   Wt 232 lb (105.2 kg)   BMI 31.46 kg/m   Specialty Comments:  No specialty comments available.  PMFS History: Patient Active Problem List   Diagnosis Date Noted  . 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 Essential hypertension 04/13/2014  . History of aortic valve replacement 04/13/2014  . Status post left foot surgery 04/13/2014   Past Medical History:  Diagnosis Date  . Arthritis    right hip  . Dysrhythmia    palpitations  . 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 Hypertension   . Umbilical hernia     Family History  Problem Relation Age of Onset  . Heart attack Mother   . Heart attack Maternal Grandmother   . Heart attack Maternal Grandfather   . Stroke Paternal Grandmother   . Stroke Paternal Grandfather   .  Heart attack Brother   . Heart attack Sister     Past Surgical History:  Procedure Laterality Date  . AORTIC ROOT REPLACEMENT  04-07-2007   aortic dissection repair and AVR  . COLONOSCOPY    . FOOT SURGERY  09-03-2005  . HERNIA REPAIR     left in 03/1984; right in 03/1977  . TOTAL HIP ARTHROPLASTY Right 08/25/2014   Procedure: TOTAL HIP ARTHROPLASTY;  Surgeon: Newt Minion, MD;  Location: [facility];  Service: Orthopedics;  Laterality: Right;  Right Total Hip Arthroplasty   Social History   Occupational History  . Not on file  Tobacco Use  . Smoking status: Never Smoker  . Smokeless tobacco: Never Used  Substance and Sexual Activity  . Alcohol use: Yes    Comment: wine occasional  . Drug use: No  . Sexual activity:  Not on file

## 2019-08-04 DIAGNOSIS — H31092 Other chorioretinal scars, left eye: Secondary | ICD-10-CM | POA: Diagnosis not present

## 2019-08-04 DIAGNOSIS — H2513 Age-related nuclear cataract, bilateral: Secondary | ICD-10-CM | POA: Diagnosis not present

## 2019-10-16 DIAGNOSIS — R972 Elevated prostate specific antigen [PSA]: Secondary | ICD-10-CM | POA: Diagnosis not present

## 2019-10-19 ENCOUNTER — Ambulatory Visit: Payer: Federal, State, Local not specified - PPO | Admitting: Orthopedic Surgery

## 2019-11-02 ENCOUNTER — Ambulatory Visit: Payer: Federal, State, Local not specified - PPO | Admitting: Orthopedic Surgery

## 2019-11-16 ENCOUNTER — Ambulatory Visit: Payer: Federal, State, Local not specified - PPO | Admitting: Orthopedic Surgery

## 2019-11-23 ENCOUNTER — Ambulatory Visit: Payer: Federal, State, Local not specified - PPO | Admitting: Orthopedic Surgery

## 2019-11-30 ENCOUNTER — Ambulatory Visit: Payer: Federal, State, Local not specified - PPO | Admitting: Orthopedic Surgery

## 2019-12-21 ENCOUNTER — Other Ambulatory Visit: Payer: Self-pay

## 2019-12-21 ENCOUNTER — Encounter: Payer: Self-pay | Admitting: Orthopedic Surgery

## 2019-12-21 ENCOUNTER — Ambulatory Visit (INDEPENDENT_AMBULATORY_CARE_PROVIDER_SITE_OTHER): Payer: Medicare Other | Admitting: Orthopedic Surgery

## 2019-12-21 VITALS — Ht 72.0 in | Wt 235.0 lb

## 2019-12-21 DIAGNOSIS — I87323 Chronic venous hypertension (idiopathic) with inflammation of bilateral lower extremity: Secondary | ICD-10-CM | POA: Diagnosis not present

## 2019-12-21 DIAGNOSIS — B351 Tinea unguium: Secondary | ICD-10-CM

## 2019-12-22 ENCOUNTER — Encounter: Payer: Self-pay | Admitting: Orthopedic Surgery

## 2019-12-22 NOTE — Progress Notes (Signed)
Office Visit Note   Patient: Michael Moore           Date of Birth: 07-16-1951           MRN: [id number] Visit Date: 12/21/2019              Requested by: Wenda Low, MD [address] [place],  [place] [address] PCP: Wenda Low, MD  Chief Complaint  Patient presents with  . Right Foot - Follow-up    Wants toenail trim  . Left Foot - Follow-up    Wants toenail trim      HPI: Patient is a 69 year old gentleman who presents for evaluation of both lower extremities he has a history of onychomycosis as well as venous insufficiency.  Assessment & Plan: Visit Diagnoses:  1. Onychomycosis   2. Idiopathic chronic venous hypertension of both lower extremities with inflammation     Plan: Recommended [place] sneakers to further protect his feet.  Nails were trimmed Q000111Q without complication.  No venous ulcers.  Follow-Up Instructions: Return in about 3 months (around 03/19/2020).   Ortho Exam  Patient is alert, oriented, no adenopathy, well-dressed, normal affect, normal respiratory effort. Examination there are no venous ulcers in the lower extremity there is pitting edema.  Patient has no plantar ulcers in either foot.  Patient does have thickened discolored onychomycotic nails x10 he is unable to safely trim the nails on his own and the nails were trimmed Q000111Q without complications.  Imaging: No results found. No images are attached to the encounter.  Labs: No results found for: HGBA1C, ESRSEDRATE, CRP, LABURIC, REPTSTATUS, GRAMSTAIN, CULT, LABORGA   Lab Results  Component Value Date   ALBUMIN 3.7 08/11/2014    No results found for: MG No results found for: VD25OH  No results found for: PREALBUMIN CBC EXTENDED Latest Ref Rng & Units 08/27/2014 08/26/2014 08/11/2014  WBC 4.0 - 10.5 K/uL 7.7 8.7 6.6  RBC 4.22 - 5.81 MIL/uL 3.90(L) 3.86(L) 4.94  HGB 13.0 - 17.0 g/dL 12.0(L) 12.0(L) 15.3  HCT 39.0 - 52.0 % 35.7(L) 34.9(L) 43.7  PLT 150 - 400 K/uL 178  179 195  NEUTROABS - - - -  LYMPHSABS - - - -     Body mass index is 31.87 kg/m.  Orders:  No orders of the defined types were placed in this encounter.  No orders of the defined types were placed in this encounter.    Procedures: No procedures performed  Clinical Data: No additional findings.  ROS:  All other systems negative, except as noted in the HPI. Review of Systems  Objective: Vital Signs: Ht 6' (1.829 m)   Wt 235 lb (106.6 kg)   BMI 31.87 kg/m   Specialty Comments:  No specialty comments available.  PMFS History: Patient Active Problem List   Diagnosis Date Noted  . 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 Essential hypertension 04/13/2014  . History of aortic valve replacement 04/13/2014  . Status post left foot surgery 04/13/2014   Past Medical History:  Diagnosis Date  . Arthritis    right hip  . Dysrhythmia    palpitations  . 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 Hypertension   . Umbilical hernia     Family History  Problem Relation Age of Onset  . Heart attack Mother   . Heart attack Maternal Grandmother   . Heart attack Maternal Grandfather   . Stroke Paternal Grandmother   .  Stroke Paternal Grandfather   . Heart attack Brother   . Heart attack Sister     Past Surgical History:  Procedure Laterality Date  . AORTIC ROOT REPLACEMENT  04-07-2007   aortic dissection repair and AVR  . COLONOSCOPY    . FOOT SURGERY  09-03-2005  . HERNIA REPAIR     left in 03/1984; right in 03/1977  . TOTAL HIP ARTHROPLASTY Right 08/25/2014   Procedure: TOTAL HIP ARTHROPLASTY;  Surgeon: Newt Minion, MD;  Location: [facility];  Service: Orthopedics;  Laterality: Right;  Right Total Hip Arthroplasty   Social History   Occupational History  . Not on file  Tobacco Use  . Smoking status: Never Smoker  . Smokeless tobacco: Never Used  Substance and Sexual Activity  . Alcohol use: Yes    Comment: wine occasional  . Drug use: No    . Sexual activity: Not on file

## 2020-01-11 ENCOUNTER — Ambulatory Visit (INDEPENDENT_AMBULATORY_CARE_PROVIDER_SITE_OTHER): Payer: Medicare Other | Admitting: Physician Assistant

## 2020-01-11 ENCOUNTER — Encounter: Payer: Self-pay | Admitting: Orthopedic Surgery

## 2020-01-11 ENCOUNTER — Ambulatory Visit (INDEPENDENT_AMBULATORY_CARE_PROVIDER_SITE_OTHER): Payer: Medicare Other

## 2020-01-11 ENCOUNTER — Other Ambulatory Visit: Payer: Self-pay | Admitting: Physician Assistant

## 2020-01-11 ENCOUNTER — Other Ambulatory Visit: Payer: Self-pay

## 2020-01-11 VITALS — Ht 72.0 in | Wt 235.0 lb

## 2020-01-11 DIAGNOSIS — M5442 Lumbago with sciatica, left side: Secondary | ICD-10-CM | POA: Diagnosis not present

## 2020-01-11 MED ORDER — PREDNISONE 10 MG PO TABS: 20.0000 mg | ORAL_TABLET | Freq: Every day | ORAL | 0 refills | Status: DC

## 2020-01-11 NOTE — Progress Notes (Signed)
Office Visit Note   Patient: Michael Moore           Date of Birth: 03-24-1951           MRN: [id number] Visit Date: 01/11/2020              Requested by: Wenda Low, MD [address] [place],  [place] [address] PCP: Wenda Low, MD  Chief Complaint  Patient presents with  . Left Hip - Follow-up, Pain    03/2019 s/p cortisone injection left hip       HPI: This is a pleasant 69 year old gentleman who presents with a chief complaint of left lower back pain.  At his last visit last summer he had findings consistent with trochanteric bursitis in the left hip.  He was given an injection and did quite well.  He was wondering if he could have another injection for this current issue.  He denies any specific injury but points to the area of maximal pain in the lower left back and this does shoot down into his leg.  It does bother him when he is sitting for a while and goes to stand up  Assessment & Plan: Visit Diagnoses:  1. Left-sided low back pain with left-sided sciatica, unspecified chronicity     Plan: Findings more consistent with some lumbar back pain and sciatica.  I talked about the natural history of this.  I am going to try him on a course of prednisone 40 mg p.o. with breakfast for the next few weeks.  If he is doing well he can wean this down to 1 daily.  We will follow up in 3 weeks to see how he is doing  Follow-Up Instructions: No follow-ups on file.   Ortho Exam  Patient is alert, oriented, no adenopathy, well-dressed, normal affect, normal respiratory effort. Focused examination demonstrates tenderness to the low back to deep palpation.  Mild straight leg raise.  Strength is equal bilaterally.  No paresthesias.  No antalgic gait  Imaging: No results found. No images are attached to the encounter.  Labs: No results found for: HGBA1C, ESRSEDRATE, CRP, LABURIC, REPTSTATUS, GRAMSTAIN, CULT, LABORGA   Lab Results  Component Value Date   ALBUMIN  3.7 08/11/2014    No results found for: MG No results found for: VD25OH  No results found for: PREALBUMIN CBC EXTENDED Latest Ref Rng & Units 08/27/2014 08/26/2014 08/11/2014  WBC 4.0 - 10.5 K/uL 7.7 8.7 6.6  RBC 4.22 - 5.81 MIL/uL 3.90(L) 3.86(L) 4.94  HGB 13.0 - 17.0 g/dL 12.0(L) 12.0(L) 15.3  HCT 39.0 - 52.0 % 35.7(L) 34.9(L) 43.7  PLT 150 - 400 K/uL 178 179 195  NEUTROABS - - - -  LYMPHSABS - - - -     Body mass index is 31.87 kg/m.  Orders:  Orders Placed This Encounter  Procedures  . XR Lumbar Spine 2-3 Views   No orders of the defined types were placed in this encounter.    Procedures: No procedures performed  Clinical Data: No additional findings.  ROS:  All other systems negative, except as noted in the HPI. Review of Systems  Objective: Vital Signs: Ht 6' (1.829 m)   Wt 235 lb (106.6 kg)   BMI 31.87 kg/m   Specialty Comments:  No specialty comments available.  PMFS History: Patient Active Problem List   Diagnosis Date Noted  . 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 Essential hypertension 04/13/2014  . History of aortic valve  replacement 04/13/2014  . Status post left foot surgery 04/13/2014   Past Medical History:  Diagnosis Date  . Arthritis    right hip  . Dysrhythmia    palpitations  . 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 Hypertension   . Umbilical hernia     Family History  Problem Relation Age of Onset  . Heart attack Mother   . Heart attack Maternal Grandmother   . Heart attack Maternal Grandfather   . Stroke Paternal Grandmother   . Stroke Paternal Grandfather   . Heart attack Brother   . Heart attack Sister     Past Surgical History:  Procedure Laterality Date  . AORTIC ROOT REPLACEMENT  04-07-2007   aortic dissection repair and AVR  . COLONOSCOPY    . FOOT SURGERY  09-03-2005  . HERNIA REPAIR     left in 03/1984; right in 03/1977  . TOTAL HIP ARTHROPLASTY Right 08/25/2014   Procedure:  TOTAL HIP ARTHROPLASTY;  Surgeon: Newt Minion, MD;  Location: [facility];  Service: Orthopedics;  Laterality: Right;  Right Total Hip Arthroplasty   Social History   Occupational History  . Not on file  Tobacco Use  . Smoking status: Never Smoker  . Smokeless tobacco: Never Used  Substance and Sexual Activity  . Alcohol use: Yes    Comment: wine occasional  . Drug use: No  . Sexual activity: Not on file

## 2020-02-01 ENCOUNTER — Ambulatory Visit: Payer: Federal, State, Local not specified - PPO | Admitting: Physician Assistant

## 2020-02-04 ENCOUNTER — Other Ambulatory Visit: Payer: Self-pay | Admitting: Physician Assistant

## 2020-02-12 DIAGNOSIS — I7101 Dissection of thoracic aorta: Secondary | ICD-10-CM | POA: Diagnosis not present

## 2020-02-12 DIAGNOSIS — Z79899 Other long term (current) drug therapy: Secondary | ICD-10-CM | POA: Diagnosis not present

## 2020-02-12 DIAGNOSIS — Z8679 Personal history of other diseases of the circulatory system: Secondary | ICD-10-CM | POA: Diagnosis not present

## 2020-02-12 DIAGNOSIS — I10 Essential (primary) hypertension: Secondary | ICD-10-CM | POA: Diagnosis not present

## 2020-02-12 DIAGNOSIS — Z952 Presence of prosthetic heart valve: Secondary | ICD-10-CM | POA: Diagnosis not present

## 2020-03-21 ENCOUNTER — Other Ambulatory Visit: Payer: Self-pay

## 2020-03-21 ENCOUNTER — Ambulatory Visit (INDEPENDENT_AMBULATORY_CARE_PROVIDER_SITE_OTHER): Payer: Medicare Other | Admitting: Physician Assistant

## 2020-03-21 ENCOUNTER — Encounter: Payer: Self-pay | Admitting: Orthopedic Surgery

## 2020-03-21 VITALS — Ht 72.0 in | Wt 235.0 lb

## 2020-03-21 DIAGNOSIS — B351 Tinea unguium: Secondary | ICD-10-CM | POA: Diagnosis not present

## 2020-03-21 NOTE — Progress Notes (Signed)
Office Visit Note   Patient: Michael Moore           Date of Birth: 12-15-1950           MRN: [id number] Visit Date: 03/21/2020              Requested by: Wenda Low, MD [address] [place],  [place] [address] PCP: Wenda Low, MD  Chief Complaint  Patient presents with  . Lower Back - Follow-up      HPI: This is a pleasant gentleman who follows up for his low back pain.  He is also here to have his toenails trimmed.  With regards to his back pain it has resolved and he has weaned off the prednisone  Assessment & Plan: Visit Diagnoses: No diagnosis found.  Plan: Follow-up in 3 months for repeat nail trim or if his painful back symptoms reappear  Follow-Up Instructions: No follow-ups on file.   Ortho Exam  Patient is alert, oriented, no adenopathy, well-dressed, normal affect, normal respiratory effort. Feet are in excellent condition he does have onychomycosis bilateral feet.  After obtaining verbal consent nails were trimmed back to a healthy surface x9  Imaging: No results found. No images are attached to the encounter.  Labs: No results found for: HGBA1C, ESRSEDRATE, CRP, LABURIC, REPTSTATUS, GRAMSTAIN, CULT, LABORGA   Lab Results  Component Value Date   ALBUMIN 3.7 08/11/2014    No results found for: MG No results found for: VD25OH  No results found for: PREALBUMIN CBC EXTENDED Latest Ref Rng & Units 08/27/2014 08/26/2014 08/11/2014  WBC 4.0 - 10.5 K/uL 7.7 8.7 6.6  RBC 4.22 - 5.81 MIL/uL 3.90(L) 3.86(L) 4.94  HGB 13.0 - 17.0 g/dL 12.0(L) 12.0(L) 15.3  HCT 39.0 - 52.0 % 35.7(L) 34.9(L) 43.7  PLT 150 - 400 K/uL 178 179 195  NEUTROABS - - - -  LYMPHSABS - - - -     Body mass index is 31.87 kg/m.  Orders:  No orders of the defined types were placed in this encounter.  No orders of the defined types were placed in this encounter.    Procedures: No procedures performed  Clinical Data: No additional  findings.  ROS:  All other systems negative, except as noted in the HPI. Review of Systems  Objective: Vital Signs: Ht 6' (1.829 m)   Wt 235 lb (106.6 kg)   BMI 31.87 kg/m   Specialty Comments:  No specialty comments available.  PMFS History: Patient Active Problem List   Diagnosis Date Noted  . 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 Essential hypertension 04/13/2014  . History of aortic valve replacement 04/13/2014  . Status post left foot surgery 04/13/2014   Past Medical History:  Diagnosis Date  . Arthritis    right hip  . Dysrhythmia    palpitations  . 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 Hypertension   . Umbilical hernia     Family History  Problem Relation Age of Onset  . Heart attack Mother   . Heart attack Maternal Grandmother   . Heart attack Maternal Grandfather   . Stroke Paternal Grandmother   . Stroke Paternal Grandfather   . Heart attack Brother   . Heart attack Sister     Past Surgical History:  Procedure Laterality Date  . AORTIC ROOT REPLACEMENT  04-07-2007   aortic dissection repair and AVR  . COLONOSCOPY    . FOOT  SURGERY  09-03-2005  . HERNIA REPAIR     left in 03/1984; right in 03/1977  . TOTAL HIP ARTHROPLASTY Right 08/25/2014   Procedure: TOTAL HIP ARTHROPLASTY;  Surgeon: Newt Minion, MD;  Location: [facility];  Service: Orthopedics;  Laterality: Right;  Right Total Hip Arthroplasty   Social History   Occupational History  . Not on file  Tobacco Use  . Smoking status: Never Smoker  . Smokeless tobacco: Never Used  Substance and Sexual Activity  . Alcohol use: Yes    Comment: wine occasional  . Drug use: No  . Sexual activity: Not on file

## 2020-04-11 DIAGNOSIS — R972 Elevated prostate specific antigen [PSA]: Secondary | ICD-10-CM | POA: Diagnosis not present

## 2020-04-14 ENCOUNTER — Other Ambulatory Visit: Payer: Self-pay

## 2020-04-14 ENCOUNTER — Encounter: Payer: Self-pay | Admitting: Orthopedic Surgery

## 2020-04-14 ENCOUNTER — Ambulatory Visit (INDEPENDENT_AMBULATORY_CARE_PROVIDER_SITE_OTHER): Payer: Medicare Other | Admitting: Orthopedic Surgery

## 2020-04-14 VITALS — Ht 72.0 in | Wt 235.0 lb

## 2020-04-14 DIAGNOSIS — M25551 Pain in right hip: Secondary | ICD-10-CM

## 2020-04-15 ENCOUNTER — Encounter: Payer: Self-pay | Admitting: Orthopedic Surgery

## 2020-04-15 DIAGNOSIS — M25551 Pain in right hip: Secondary | ICD-10-CM | POA: Diagnosis not present

## 2020-04-15 MED ORDER — METHYLPREDNISOLONE ACETATE 40 MG/ML IJ SUSP
40.0000 mg | INTRAMUSCULAR | Status: AC | PRN
  Administered 2020-04-15: 40 mg via INTRA_ARTICULAR

## 2020-04-15 MED ORDER — LIDOCAINE HCL 1 % IJ SOLN
5.0000 mL | INTRAMUSCULAR | Status: AC | PRN
  Administered 2020-04-15: 5 mL

## 2020-04-15 NOTE — Progress Notes (Signed)
Office Visit Note   Patient: Michael Moore           Date of Birth: 04/25/1951           MRN: [id number] Visit Date: 04/14/2020              Requested by: Wenda Low, MD [address] [place] [address] [place],  [place] [address] PCP: Wenda Low, MD  Chief Complaint  Patient presents with  . Left Hip - Pain      HPI: Patient is a 69 year old gentleman who presents with recurrent greater trochanteric pain of the right hip.  Patient underwent a right greater troches Cantero bursal injection about a year ago and this provided him good relief.  Patient is status post a right total hip arthroplasty he has no pain with weightbearing no instability with his hip denies any radicular symptoms down his leg.  Assessment & Plan: Visit Diagnoses:  1. Pain in right hip     Plan: The right greater trochanteric bursa was injected he tolerated this well follow-up as needed.  Follow-Up Instructions: Return if symptoms worsen or fail to improve.   Ortho Exam  Patient is alert, oriented, no adenopathy, well-dressed, normal affect, normal respiratory effort. Examination patient is point tender to palpation over the greater trochanter bursa.  He has no pain with internal and external rotation of the right hip he has no abductor lurch with ambulation he has no weakness he has a negative straight leg raise.  Review of his radiographs shows degenerative disc disease of the lumbar spine and a stable right total hip arthroplasty.  Imaging: No results found. No images are attached to the encounter.  Labs: No results found for: HGBA1C, ESRSEDRATE, CRP, LABURIC, REPTSTATUS, GRAMSTAIN, CULT, LABORGA   Lab Results  Component Value Date   ALBUMIN 3.7 08/11/2014    No results found for: MG No results found for: VD25OH  No results found for: PREALBUMIN CBC EXTENDED Latest Ref Rng & Units 08/27/2014 08/26/2014 08/11/2014  WBC 4.0 - 10.5 K/uL 7.7 8.7 6.6  RBC 4.22 - 5.81 MIL/uL 3.90(L) 3.86(L)  4.94  HGB 13.0 - 17.0 g/dL 12.0(L) 12.0(L) 15.3  HCT 39 - 52 % 35.7(L) 34.9(L) 43.7  PLT 150 - 400 K/uL 178 179 195  NEUTROABS - - - -  LYMPHSABS - - - -     Body mass index is 31.87 kg/m.  Orders:  No orders of the defined types were placed in this encounter.  No orders of the defined types were placed in this encounter.    Procedures: Large Joint Inj: R greater trochanter on 04/15/2020 1:24 PM Indications: pain and diagnostic evaluation Details: 22 G 1.5 in needle  Arthrogram: No  Medications: 5 mL lidocaine 1 %; 40 mg methylPREDNISolone acetate 40 MG/ML Outcome: tolerated well, no immediate complications Procedure, treatment alternatives, risks and benefits explained, specific risks discussed. Consent was given by the patient. Immediately prior to procedure a time out was called to verify the correct patient, procedure, equipment, support staff and site/side marked as required. Patient was prepped and draped in the usual sterile fashion.      Clinical Data: No additional findings.  ROS:  All other systems negative, except as noted in the HPI. Review of Systems  Objective: Vital Signs: Ht 6' (1.829 m)   Wt 235 lb (106.6 kg)   BMI 31.87 kg/m   Specialty Comments:  No specialty comments available.  PMFS History: Patient Active Problem List   Diagnosis  Date Noted  . 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 Essential hypertension 04/13/2014  . History of aortic valve replacement 04/13/2014  . Status post left foot surgery 04/13/2014   Past Medical History:  Diagnosis Date  . Arthritis    right hip  . Dysrhythmia    palpitations  . 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 Hypertension   . Umbilical hernia     Family History  Problem Relation Age of Onset  . Heart attack Mother   . Heart attack Maternal Grandmother   . Heart attack Maternal Grandfather   . Stroke Paternal Grandmother   . Stroke Paternal Grandfather   .  Heart attack Brother   . Heart attack Sister     Past Surgical History:  Procedure Laterality Date  . AORTIC ROOT REPLACEMENT  04-07-2007   aortic dissection repair and AVR  . COLONOSCOPY    . FOOT SURGERY  09-03-2005  . HERNIA REPAIR     left in 03/1984; right in 03/1977  . TOTAL HIP ARTHROPLASTY Right 08/25/2014   Procedure: TOTAL HIP ARTHROPLASTY;  Surgeon: Newt Minion, MD;  Location: [facility];  Service: Orthopedics;  Laterality: Right;  Right Total Hip Arthroplasty   Social History   Occupational History  . Not on file  Tobacco Use  . Smoking status: Never Smoker  . Smokeless tobacco: Never Used  Substance and Sexual Activity  . Alcohol use: Yes    Comment: wine occasional  . Drug use: No  . Sexual activity: Not on file

## 2020-05-12 ENCOUNTER — Ambulatory Visit (INDEPENDENT_AMBULATORY_CARE_PROVIDER_SITE_OTHER): Payer: Medicare Other | Admitting: Orthopedic Surgery

## 2020-05-12 ENCOUNTER — Other Ambulatory Visit: Payer: Self-pay

## 2020-05-12 ENCOUNTER — Encounter: Payer: Self-pay | Admitting: Orthopedic Surgery

## 2020-05-12 DIAGNOSIS — L03116 Cellulitis of left lower limb: Secondary | ICD-10-CM

## 2020-05-12 MED ORDER — DOXYCYCLINE HYCLATE 100 MG PO TABS: 100.0000 mg | ORAL_TABLET | Freq: Two times a day (BID) | ORAL | 0 refills | Status: DC

## 2020-05-13 ENCOUNTER — Telehealth: Payer: Self-pay | Admitting: Orthopedic Surgery

## 2020-05-13 ENCOUNTER — Encounter: Payer: Self-pay | Admitting: Orthopedic Surgery

## 2020-05-13 NOTE — Progress Notes (Signed)
Office Visit Note   Patient: Michael Moore           Date of Birth: 1951/07/19           MRN: [id number] Visit Date: 05/12/2020              Requested by: Wenda Low, MD [address] [place] [address] [place],  [place] [address] PCP: Wenda Low, MD  Chief Complaint  Patient presents with  . Left Leg - Wound Check      HPI: Patient is a 69 year old gentleman who presents complaining of cellulitis left lower extremity.  Patient is status post left total hip arthroplasty in 2015.  Assessment & Plan: Visit Diagnoses:  1. Cellulitis of left leg     Plan: A prescription is called in for doxycycline.  His calf measures 40 cm in circumference recommended an extra-large compression stocking.  Follow-Up Instructions: Return in about 2 weeks (around 05/26/2020).   Ortho Exam  Patient is alert, oriented, no adenopathy, well-dressed, normal affect, normal respiratory effort. Examination patient has venous swelling of the left lower extremity there is a superficial ulcer with cellulitis lateral left calf.  Patient has no pain with passive dorsiflexion of the ankle.  The calf measures 40 cm in circumference.  Imaging: No results found. No images are attached to the encounter.  Labs: No results found for: HGBA1C, ESRSEDRATE, CRP, LABURIC, REPTSTATUS, GRAMSTAIN, CULT, LABORGA   Lab Results  Component Value Date   ALBUMIN 3.7 08/11/2014    No results found for: MG No results found for: VD25OH  No results found for: PREALBUMIN CBC EXTENDED Latest Ref Rng & Units 08/27/2014 08/26/2014 08/11/2014  WBC 4.0 - 10.5 K/uL 7.7 8.7 6.6  RBC 4.22 - 5.81 MIL/uL 3.90(L) 3.86(L) 4.94  HGB 13.0 - 17.0 g/dL 12.0(L) 12.0(L) 15.3  HCT 39 - 52 % 35.7(L) 34.9(L) 43.7  PLT 150 - 400 K/uL 178 179 195  NEUTROABS - - - -  LYMPHSABS - - - -     There is no height or weight on file to calculate BMI.  Orders:  No orders of the defined types were placed in this encounter.  Meds ordered  this encounter  Medications  . doxycycline (VIBRA-TABS) 100 MG tablet    Sig: Take 1 tablet (100 mg total) by mouth 2 (two) times daily.    Dispense:  30 tablet    Refill:  0     Procedures: No procedures performed  Clinical Data: No additional findings.  ROS:  All other systems negative, except as noted in the HPI. Review of Systems  Objective: Vital Signs: There were no vitals taken for this visit.  Specialty Comments:  No specialty comments available.  PMFS History: Patient Active Problem List   Diagnosis Date Noted  . 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 Essential hypertension 04/13/2014  . History of aortic valve replacement 04/13/2014  . Status post left foot surgery 04/13/2014   Past Medical History:  Diagnosis Date  . Arthritis    right hip  . Dysrhythmia    palpitations  . 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 Hypertension   . Umbilical hernia     Family History  Problem Relation Age of Onset  . Heart attack Mother   . Heart attack Maternal Grandmother   . Heart attack Maternal Grandfather   . Stroke Paternal Grandmother   . Stroke Paternal Grandfather   . Heart  attack Brother   . Heart attack Sister     Past Surgical History:  Procedure Laterality Date  . AORTIC ROOT REPLACEMENT  04-07-2007   aortic dissection repair and AVR  . COLONOSCOPY    . FOOT SURGERY  09-03-2005  . HERNIA REPAIR     left in 03/1984; right in 03/1977  . TOTAL HIP ARTHROPLASTY Right 08/25/2014   Procedure: TOTAL HIP ARTHROPLASTY;  Surgeon: Newt Minion, MD;  Location: [facility];  Service: Orthopedics;  Laterality: Right;  Right Total Hip Arthroplasty   Social History   Occupational History  . Not on file  Tobacco Use  . Smoking status: Never Smoker  . Smokeless tobacco: Never Used  Substance and Sexual Activity  . Alcohol use: Yes    Comment: wine occasional  . Drug use: No  . Sexual activity: Not on file

## 2020-05-13 NOTE — Telephone Encounter (Signed)
Patient called asked how long should he wear the compression socks per day? The number to contact patient is [phone]

## 2020-05-16 NOTE — Telephone Encounter (Signed)
Called and sw pt to advise should put on first thing in the morning and wear though the day and can remove when more sedentary or before bed.

## 2020-05-31 ENCOUNTER — Ambulatory Visit: Payer: Federal, State, Local not specified - PPO | Admitting: Orthopedic Surgery

## 2020-06-20 ENCOUNTER — Encounter: Payer: Self-pay | Admitting: Orthopedic Surgery

## 2020-06-20 ENCOUNTER — Ambulatory Visit (INDEPENDENT_AMBULATORY_CARE_PROVIDER_SITE_OTHER): Payer: Medicare Other | Admitting: Physician Assistant

## 2020-06-20 VITALS — Ht 72.0 in | Wt 235.0 lb

## 2020-06-20 DIAGNOSIS — B351 Tinea unguium: Secondary | ICD-10-CM | POA: Diagnosis not present

## 2020-06-20 NOTE — Progress Notes (Signed)
Office Visit Note   Patient: Michael Moore           Date of Birth: January 02, 1951           MRN: [id number] Visit Date: 06/20/2020              Requested by: Wenda Low, MD [address] [place] [address] [place],  [place] [address] PCP: Wenda Low, MD  Chief Complaint  Patient presents with  . Left Leg - Follow-up    cellulitis       HPI: This is a pleasant gentleman who follows up for his left lower extremity cellulitis.  He has been wearing a vive compression stocking and he thinks it helps quite a bit in fact he is ordering another 1 he is also here to have his nails trimmed onychomycotic nails were trimmed x9  Assessment & Plan: Visit Diagnoses: No diagnosis found.  Plan: Patient will follow up in 3 months for repeat nail trim or sooner if he is having any difficulties  Follow-Up Instructions: No follow-ups on file.   Ortho Exam  Patient is alert, oriented, no adenopathy, well-dressed, normal affect, normal respiratory effort. Focused examination demonstrates left lower extremity no open ulcers she does have some varicosities and some stasis dermatitis.  No drainage no cellulitis swelling is well controlled bilateral he is onychomycotic nails  Imaging: No results found. No images are attached to the encounter.  Labs: No results found for: HGBA1C, ESRSEDRATE, CRP, LABURIC, REPTSTATUS, GRAMSTAIN, CULT, LABORGA   Lab Results  Component Value Date   ALBUMIN 3.7 08/11/2014    No results found for: MG No results found for: VD25OH  No results found for: PREALBUMIN CBC EXTENDED Latest Ref Rng & Units 08/27/2014 08/26/2014 08/11/2014  WBC 4.0 - 10.5 K/uL 7.7 8.7 6.6  RBC 4.22 - 5.81 MIL/uL 3.90(L) 3.86(L) 4.94  HGB 13.0 - 17.0 g/dL 12.0(L) 12.0(L) 15.3  HCT 39 - 52 % 35.7(L) 34.9(L) 43.7  PLT 150 - 400 K/uL 178 179 195  NEUTROABS - - - -  LYMPHSABS - - - -     Body mass index is 31.87 kg/m.  Orders:  No orders of the defined types were placed in this  encounter.  No orders of the defined types were placed in this encounter.    Procedures: No procedures performed  Clinical Data: No additional findings.  ROS:  All other systems negative, except as noted in the HPI. Review of Systems  Objective: Vital Signs: Ht 6' (1.829 m)   Wt 235 lb (106.6 kg)   BMI 31.87 kg/m   Specialty Comments:  No specialty comments available.  PMFS History: Patient Active Problem List   Diagnosis Date Noted  . 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 Essential hypertension 04/13/2014  . History of aortic valve replacement 04/13/2014  . Status post left foot surgery 04/13/2014   Past Medical History:  Diagnosis Date  . Arthritis    right hip  . Dysrhythmia    palpitations  . 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 Hypertension   . Umbilical hernia     Family History  Problem Relation Age of Onset  . Heart attack Mother   . Heart attack Maternal Grandmother   . Heart attack Maternal Grandfather   . Stroke Paternal Grandmother   . Stroke Paternal Grandfather   . Heart attack Brother   . Heart attack Sister     Past  Surgical History:  Procedure Laterality Date  . AORTIC ROOT REPLACEMENT  04-07-2007   aortic dissection repair and AVR  . COLONOSCOPY    . FOOT SURGERY  09-03-2005  . HERNIA REPAIR     left in 03/1984; right in 03/1977  . TOTAL HIP ARTHROPLASTY Right 08/25/2014   Procedure: TOTAL HIP ARTHROPLASTY;  Surgeon: Newt Minion, MD;  Location: [facility];  Service: Orthopedics;  Laterality: Right;  Right Total Hip Arthroplasty   Social History   Occupational History  . Not on file  Tobacco Use  . Smoking status: Never Smoker  . Smokeless tobacco: Never Used  Substance and Sexual Activity  . Alcohol use: Yes    Comment: wine occasional  . Drug use: No  . Sexual activity: Not on file

## 2020-07-23 ENCOUNTER — Emergency Department (HOSPITAL_COMMUNITY)
Admission: EM | Admit: 2020-07-23 | Discharge: 2020-07-23 | Disposition: A | Discharge status: Home / Self Care | Payer: Medicare Other | Attending: Emergency Medicine | Admitting: Emergency Medicine

## 2020-07-23 ENCOUNTER — Encounter (HOSPITAL_COMMUNITY): Payer: Self-pay | Admitting: Emergency Medicine

## 2020-07-23 ENCOUNTER — Other Ambulatory Visit: Payer: Self-pay

## 2020-07-23 DIAGNOSIS — I10 Essential (primary) hypertension: Secondary | ICD-10-CM | POA: Diagnosis not present

## 2020-07-23 DIAGNOSIS — S95901A Unspecified injury of unspecified blood vessel at ankle and foot level, right leg, initial encounter: Secondary | ICD-10-CM | POA: Diagnosis not present

## 2020-07-23 DIAGNOSIS — R58 Hemorrhage, not elsewhere classified: Secondary | ICD-10-CM | POA: Diagnosis not present

## 2020-07-23 DIAGNOSIS — Z96649 Presence of unspecified artificial hip joint: Secondary | ICD-10-CM | POA: Diagnosis not present

## 2020-07-23 DIAGNOSIS — Z79899 Other long term (current) drug therapy: Secondary | ICD-10-CM | POA: Diagnosis not present

## 2020-07-23 LAB — CBC
HCT: 43.1 % (ref 39.0–52.0)
Hemoglobin: 14.5 g/dL (ref 13.0–17.0)
MCH: 31.3 pg (ref 26.0–34.0)
MCHC: 33.6 g/dL (ref 30.0–36.0)
MCV: 93.1 fL (ref 80.0–100.0)
Platelets: 183 10*3/uL (ref 150–400)
RBC: 4.63 MIL/uL (ref 4.22–5.81)
RDW: 13.1 % (ref 11.5–15.5)
WBC: 7.3 10*3/uL (ref 4.0–10.5)
nRBC: 0 % (ref 0.0–0.2)

## 2020-07-23 LAB — PROTIME-INR
INR: 1 (ref 0.8–1.2)
Prothrombin Time: 12.7 seconds (ref 11.4–15.2)

## 2020-07-23 MED ORDER — LIDOCAINE-EPINEPHRINE (PF) 2 %-1:200000 IJ SOLN
20.0000 mL | Freq: Once | INTRAMUSCULAR | Status: AC
  Administered 2020-07-23: 20 mL
  Filled 2020-07-23: qty 20

## 2020-07-23 NOTE — ED Provider Notes (Signed)
[facility] DEPT Provider Note   CSN: [id number] Arrival date & time: 07/23/20  2055     History Chief Complaint  Patient presents with  . Foot Problem    Bleeding    Michael Moore is a 69 y.o. male.  Patient presents to the emergency department with a chief complaint of bleeding.  He states that he was taking a nap today and felt wetness on his foot.  States that when he looked down there was blood pouring out of a small vessel on the side of his foot.  He denies any known injury.  He states that he was out in the park earlier today, and questions whether he was bitten by something.  He denies being anticoagulated, but is noted that he takes 81 mg of aspirin daily.  He denies any bleeding disorders.  Denies any history of the same.  Denies any pain.  The history is provided by the patient. No language interpreter was used.       Past Medical History:  Diagnosis Date  . Arthritis    right hip  . Dysrhythmia    palpitations  . 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 Hypertension   . Umbilical hernia     Patient Active Problem List   Diagnosis Date Noted  . 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 Essential hypertension 04/13/2014  . History of aortic valve replacement 04/13/2014  . Status post left foot surgery 04/13/2014    Past Surgical History:  Procedure Laterality Date  . AORTIC ROOT REPLACEMENT  04-07-2007   aortic dissection repair and AVR  . COLONOSCOPY    . FOOT SURGERY  09-03-2005  . HERNIA REPAIR     left in 03/1984; right in 03/1977  . TOTAL HIP ARTHROPLASTY Right 08/25/2014   Procedure: TOTAL HIP ARTHROPLASTY;  Surgeon: Newt Minion, MD;  Location: [facility];  Service: Orthopedics;  Laterality: Right;  Right Total Hip Arthroplasty       Family History  Problem Relation Age of Onset  . Heart attack Mother   . Heart attack Maternal Grandmother   . Heart attack Maternal Grandfather   . Stroke  Paternal Grandmother   . Stroke Paternal Grandfather   . Heart attack Brother   . Heart attack Sister     Social History   Tobacco Use  . Smoking status: Never Smoker  . Smokeless tobacco: Never Used  Substance Use Topics  . Alcohol use: Yes    Comment: wine occasional  . Drug use: No    Home Medications Prior to Admission medications   Medication Sig Start Date End Date Taking? Authorizing Provider  aspirin 81 MG tablet Take 81 mg by mouth daily.    [provider]  aspirin EC 81 MG tablet Take 1 tablet (81 mg total) by mouth daily. 08/26/14   Newt Minion, MD  calcium citrate-vitamin D (CITRACAL+D) 315-200 MG-UNIT per tablet Take 1 tablet by mouth daily.    [provider]  doxycycline (VIBRA-TABS) 100 MG tablet Take 1 tablet (100 mg total) by mouth 2 (two) times daily. 05/12/20   Newt Minion, MD  losartan-hydrochlorothiazide (HYZAAR) 100-25 MG per tablet Take 1 tablet by mouth daily.  03/23/14   [provider]  metoprolol succinate (TOPROL-XL) 25 MG 24 hr tablet Take 25 mg by mouth daily.  03/23/14   [provider]  Multiple Vitamins-Minerals (MULTIVITAMIN PO) Take 1  tablet by mouth daily.    [provider]  predniSONE (DELTASONE) 10 MG tablet TAKE 2 TABLETS BY MOUTH EVERY DAY 02/04/20   Persons, Bevely Palmer, [place]    Allergies    Patient has no known allergies.  Review of Systems   Review of Systems  All other systems reviewed and are negative.   Physical Exam Updated Vital Signs BP (!) 177/87 (BP Location: Right Arm)   Pulse 83   Temp 98.2 F (36.8 C) (Oral)   Resp 16   Ht 6' (1.829 m)   Wt 104.3 kg   SpO2 95%   BMI 31.19 kg/m   Physical Exam Vitals and nursing note reviewed.  Constitutional:      General: He is not in acute distress.    Appearance: He is well-developed. He is not ill-appearing.  HENT:     Head: Normocephalic and atraumatic.  Eyes:     Conjunctiva/sclera: Conjunctivae normal.  Cardiovascular:       Rate and Rhythm: Normal rate.  Pulmonary:     Effort: Pulmonary effort is normal. No respiratory distress.  Abdominal:     General: There is no distension.  Musculoskeletal:     Cervical back: Neck supple.     Comments: Moves all extremities  Skin:    General: Skin is warm and dry.     Comments: Very small bleeding vessel to the right lateral ankle  Neurological:     Mental Status: He is alert and oriented to person, place, and time.  Psychiatric:        Mood and Affect: Mood normal.        Behavior: Behavior normal.     ED Results / Procedures / Treatments   Labs (all labs ordered are listed, but only abnormal results are displayed) Labs Reviewed  CBC  PROTIME-INR    EKG None  Radiology No results found.  Procedures Wound repair  Date/Time: 07/23/2020 10:59 PM Performed by: Montine Circle, PA-C Authorized by: Montine Circle, PA-C  Local anesthesia used: yes Anesthesia: local infiltration  Anesthesia: Local anesthesia used: yes Local Anesthetic: lidocaine 1% with epinephrine Patient tolerance: patient tolerated the procedure well with no immediate complications Comments: Hemostasis achieved with a single figure-of-eight suture    (including critical care time)  Medications Ordered in ED Medications  lidocaine-EPINEPHrine (XYLOCAINE W/EPI) 2 %-1:200000 (PF) injection 20 mL (has no administration in time range)    ED Course  I have reviewed the triage vital signs and the nursing notes.  Pertinent labs & imaging results that were available during my care of the patient were reviewed by me and considered in my medical decision making (see chart for details).    MDM Rules/Calculators/A&P                          Patient has a small bleeding vessel on the right ankle.  It is nonpulsatile, but it is spraying out continuously.  Will place a figure-of-eight suture.  Will check platelet count and PT/INR.  CBC and INR are normal.  Hemostasis achieved  with a single figure of eight suture.     Final Clinical Impression(s) / ED Diagnoses Final diagnoses:  Bleeding    Rx / DC Orders ED Discharge Orders    None       Montine Circle, PA-C 07/23/20 2318    Lajean Saver, MD 07/25/20 1455

## 2020-07-23 NOTE — ED Notes (Signed)
Pt discharged. Instructions given. AAOX4. Pt in no apparent distress or pain. The opportunity to ask questions was provided.  

## 2020-07-23 NOTE — Discharge Instructions (Addendum)
Please keep the suture clean and dry for the next 24 hours.  You may shower after that, but do not bathe or soak the wound in water.  Your blood tests were all normal.

## 2020-07-23 NOTE — ED Triage Notes (Signed)
Patient arrives via [place]. Patient states he isn't sure if he was bit by something. Patient states he woke up out of sleep to his bed wet with blood. Patient states he has a small pinhole sized wound on the right side of his right foot. Patient denies any known injury, states he was at the park today.

## 2020-08-01 DIAGNOSIS — Z4802 Encounter for removal of sutures: Secondary | ICD-10-CM | POA: Diagnosis not present

## 2020-08-05 DIAGNOSIS — H2513 Age-related nuclear cataract, bilateral: Secondary | ICD-10-CM | POA: Diagnosis not present

## 2020-08-05 DIAGNOSIS — H32 Chorioretinal disorders in diseases classified elsewhere: Secondary | ICD-10-CM | POA: Diagnosis not present

## 2020-08-05 DIAGNOSIS — H31012 Macula scars of posterior pole (postinflammatory) (post-traumatic), left eye: Secondary | ICD-10-CM | POA: Diagnosis not present

## 2020-09-05 ENCOUNTER — Encounter: Payer: Self-pay | Admitting: Orthopedic Surgery

## 2020-09-05 ENCOUNTER — Ambulatory Visit (INDEPENDENT_AMBULATORY_CARE_PROVIDER_SITE_OTHER): Payer: Medicare Other | Admitting: Orthopedic Surgery

## 2020-09-05 VITALS — Ht 72.0 in | Wt 230.0 lb

## 2020-09-05 DIAGNOSIS — M7061 Trochanteric bursitis, right hip: Secondary | ICD-10-CM | POA: Diagnosis not present

## 2020-09-05 DIAGNOSIS — B351 Tinea unguium: Secondary | ICD-10-CM | POA: Diagnosis not present

## 2020-09-05 DIAGNOSIS — M25551 Pain in right hip: Secondary | ICD-10-CM

## 2020-09-05 MED ORDER — LIDOCAINE HCL 1 % IJ SOLN
5.0000 mL | INTRAMUSCULAR | Status: AC | PRN
  Administered 2020-09-05: 5 mL

## 2020-09-05 MED ORDER — METHYLPREDNISOLONE ACETATE 40 MG/ML IJ SUSP
40.0000 mg | INTRAMUSCULAR | Status: AC | PRN
  Administered 2020-09-05: 40 mg via INTRA_ARTICULAR

## 2020-09-05 NOTE — Progress Notes (Signed)
Office Visit Note   Patient: Michael Moore           Date of Birth: 20-Aug-1951           MRN: [id number] Visit Date: 09/05/2020              Requested by: Wenda Low, MD [address] [place] [address] [place],  [place] [address] PCP: Wenda Low, MD  Chief Complaint  Patient presents with  . Left Hip - Follow-up    S/p cortisone injection right hip 04/14/20      HPI: Patient is a 69 year old gentleman who presents with 2 separate issues.  #1 he has had recurrent greater trochanter bursal pain of the right hip previous injection was 3 months ago which he states did provide good relief.  Patient also has thickened discolored onychomycotic nails x10 that he is unable to safely trim on his own.  Assessment & Plan: Visit Diagnoses:  1. Onychomycosis   2. Pain in right hip   3. Trochanteric bursitis, right hip     Plan: Nails were trimmed x10 greater trochanter bursa was injected.  Follow-Up Instructions: Return in about 3 months (around 12/06/2020).   Ortho Exam  Patient is alert, oriented, no adenopathy, well-dressed, normal affect, normal respiratory effort. Examination patient has pain directly over the greater trochanter bursa.  There is no redness no cellulitis no fluctuance no signs of infection.  He has a negative straight leg raise has no back pain no sciatic symptoms.  Patient has thickened discolored onychomycotic nails x10 there is no signs of infection he is unable to safely trim the nails on his own and the nails were trimmed G38 without complications.  Imaging: No results found. No images are attached to the encounter.  Labs: No results found for: HGBA1C, ESRSEDRATE, CRP, LABURIC, REPTSTATUS, GRAMSTAIN, CULT, LABORGA   Lab Results  Component Value Date   ALBUMIN 3.7 08/11/2014    No results found for: MG No results found for: VD25OH  No results found for: PREALBUMIN CBC EXTENDED Latest Ref Rng & Units 07/23/2020 08/27/2014 08/26/2014  WBC 4.0 - 10.5  K/uL 7.3 7.7 8.7  RBC 4.22 - 5.81 MIL/uL 4.63 3.90(L) 3.86(L)  HGB 13.0 - 17.0 g/dL 14.5 12.0(L) 12.0(L)  HCT 39 - 52 % 43.1 35.7(L) 34.9(L)  PLT 150 - 400 K/uL 183 178 179  NEUTROABS - - - -  LYMPHSABS - - - -     Body mass index is 31.19 kg/m.  Orders:  No orders of the defined types were placed in this encounter.  No orders of the defined types were placed in this encounter.    Procedures: Large Joint Inj: R greater trochanter on 09/05/2020 4:45 PM Indications: pain and diagnostic evaluation Details: 22 G 1.5 in needle, lateral approach  Arthrogram: No  Medications: 5 mL lidocaine 1 %; 40 mg methylPREDNISolone acetate 40 MG/ML Outcome: tolerated well, no immediate complications Procedure, treatment alternatives, risks and benefits explained, specific risks discussed. Consent was given by the patient. Immediately prior to procedure a time out was called to verify the correct patient, procedure, equipment, support staff and site/side marked as required. Patient was prepped and draped in the usual sterile fashion.      Clinical Data: No additional findings.  ROS:  All other systems negative, except as noted in the HPI. Review of Systems  Objective: Vital Signs: Ht 6' (1.829 m)   Wt 230 lb (104.3 kg)   BMI 31.19 kg/m   Specialty  Comments:  No specialty comments available.  PMFS History: Patient Active Problem List   Diagnosis Date Noted  . 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 Essential hypertension 04/13/2014  . History of aortic valve replacement 04/13/2014  . Status post left foot surgery 04/13/2014   Past Medical History:  Diagnosis Date  . Arthritis    right hip  . Dysrhythmia    palpitations  . 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 Hypertension   . Umbilical hernia     Family History  Problem Relation Age of Onset  . Heart attack Mother   . Heart attack Maternal Grandmother   . Heart attack Maternal Grandfather    . Stroke Paternal Grandmother   . Stroke Paternal Grandfather   . Heart attack Brother   . Heart attack Sister     Past Surgical History:  Procedure Laterality Date  . AORTIC ROOT REPLACEMENT  04-07-2007   aortic dissection repair and AVR  . COLONOSCOPY    . FOOT SURGERY  09-03-2005  . HERNIA REPAIR     left in 03/1984; right in 03/1977  . TOTAL HIP ARTHROPLASTY Right 08/25/2014   Procedure: TOTAL HIP ARTHROPLASTY;  Surgeon: Newt Minion, MD;  Location: [facility];  Service: Orthopedics;  Laterality: Right;  Right Total Hip Arthroplasty   Social History   Occupational History  . Not on file  Tobacco Use  . Smoking status: Never Smoker  . Smokeless tobacco: Never Used  Substance and Sexual Activity  . Alcohol use: Yes    Comment: wine occasional  . Drug use: No  . Sexual activity: Not on file

## 2020-09-15 ENCOUNTER — Telehealth: Payer: Self-pay

## 2020-09-15 NOTE — Telephone Encounter (Signed)
Patient called he stated he lost the brochure for compression socks he stated if you could leave him another one he can come pick it up. [phone]

## 2020-09-16 NOTE — Telephone Encounter (Signed)
Called pt o advise that this was at the front desk for pick up. Lm on vm to call with questions.

## 2020-09-19 ENCOUNTER — Ambulatory Visit: Payer: Federal, State, Local not specified - PPO | Admitting: Orthopedic Surgery

## 2020-12-12 ENCOUNTER — Ambulatory Visit (INDEPENDENT_AMBULATORY_CARE_PROVIDER_SITE_OTHER): Payer: Medicare Other | Admitting: Orthopedic Surgery

## 2020-12-12 ENCOUNTER — Encounter: Payer: Self-pay | Admitting: Orthopedic Surgery

## 2020-12-12 VITALS — Ht 72.0 in | Wt 230.0 lb

## 2020-12-12 DIAGNOSIS — I872 Venous insufficiency (chronic) (peripheral): Secondary | ICD-10-CM | POA: Diagnosis not present

## 2020-12-12 DIAGNOSIS — B351 Tinea unguium: Secondary | ICD-10-CM | POA: Diagnosis not present

## 2020-12-12 NOTE — Progress Notes (Signed)
Office Visit Note   Patient: Michael Moore           Date of Birth: [birth year]           MRN: [id number] Visit Date: 12/12/2020              Requested by: Wenda Low, MD [address] [place] [address] [place],  [place] [address] PCP: Wenda Low, MD  Chief Complaint  Patient presents with  . Right Foot - Pain      HPI: Patient is a 70 year old gentleman who presents stating that has had some itching and is concerned that he may have been bit by an insect or have an infection.  He is applying Neosporin to the right foot.  Assessment & Plan: Visit Diagnoses:  1. Onychomycosis   2. Venous stasis dermatitis of both lower extremities     Plan: Recommend size extra-large compression stockings for the venous insufficiency.  Nails were trimmed U93 without complications.  No signs of infection.  Follow-Up Instructions: Return in about 3 months (around 03/11/2021).   Ortho Exam  Patient is alert, oriented, no adenopathy, well-dressed, normal affect, normal respiratory effort. Examination patient has palpable pulses he has brawny skin color changes in both legs with large varicose veins there are no open ulcers no cellulitis he does have dry cracked skin on the feet but no open ulcers no signs of infection.  He has thickened discolored onychomycotic nails x10 these were trimmed A35 without complications.  Imaging: No results found. No images are attached to the encounter.  Labs: No results found for: HGBA1C, ESRSEDRATE, CRP, LABURIC, REPTSTATUS, GRAMSTAIN, CULT, LABORGA   Lab Results  Component Value Date   ALBUMIN 3.7 08/11/2014    No results found for: MG No results found for: VD25OH  No results found for: PREALBUMIN CBC EXTENDED Latest Ref Rng & Units 07/23/2020 08/27/2014 08/26/2014  WBC 4.0 - 10.5 K/uL 7.3 7.7 8.7  RBC 4.22 - 5.81 MIL/uL 4.63 3.90(L) 3.86(L)  HGB 13.0 - 17.0 g/dL 14.5 12.0(L) 12.0(L)  HCT 39.0 - 52.0 % 43.1 35.7(L) 34.9(L)  PLT 150 - 400 K/uL  183 178 179  NEUTROABS - - - -  LYMPHSABS - - - -     Body mass index is 31.19 kg/m.  Orders:  No orders of the defined types were placed in this encounter.  No orders of the defined types were placed in this encounter.    Procedures: No procedures performed  Clinical Data: No additional findings.  ROS:  All other systems negative, except as noted in the HPI. Review of Systems  Objective: Vital Signs: Ht 6' (1.829 m)   Wt 230 lb (104.3 kg)   BMI 31.19 kg/m   Specialty Comments:  No specialty comments available.  PMFS History: Patient Active Problem List   Diagnosis Date Noted  . 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 Essential hypertension 04/13/2014  . History of aortic valve replacement 04/13/2014  . Status post left foot surgery 04/13/2014   Past Medical History:  Diagnosis Date  . Arthritis    right hip  . Dysrhythmia    palpitations  . 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 Hypertension   . Umbilical hernia     Family History  Problem Relation Age of Onset  . Heart attack Mother   . Heart attack Maternal Grandmother   . Heart attack Maternal Grandfather   . Stroke Paternal Grandmother   .  Stroke Paternal Grandfather   . Heart attack Brother   . Heart attack Sister     Past Surgical History:  Procedure Laterality Date  . AORTIC ROOT REPLACEMENT  04-07-2007   aortic dissection repair and AVR  . COLONOSCOPY    . FOOT SURGERY  09-03-2005  . HERNIA REPAIR     left in 03/1984; right in 03/1977  . TOTAL HIP ARTHROPLASTY Right 08/25/2014   Procedure: TOTAL HIP ARTHROPLASTY;  Surgeon: Newt Minion, MD;  Location: [facility];  Service: Orthopedics;  Laterality: Right;  Right Total Hip Arthroplasty   Social History   Occupational History  . Not on file  Tobacco Use  . Smoking status: Never Smoker  . Smokeless tobacco: Never Used  Substance and Sexual Activity  . Alcohol use: Yes    Comment: wine occasional  . Drug use:  No  . Sexual activity: Not on file

## 2020-12-20 ENCOUNTER — Ambulatory Visit: Payer: Federal, State, Local not specified - PPO | Admitting: Orthopedic Surgery

## 2020-12-20 DIAGNOSIS — K621 Rectal polyp: Secondary | ICD-10-CM | POA: Diagnosis not present

## 2020-12-20 DIAGNOSIS — Z86010 Personal history of colonic polyps: Secondary | ICD-10-CM | POA: Diagnosis not present

## 2020-12-20 DIAGNOSIS — D128 Benign neoplasm of rectum: Secondary | ICD-10-CM | POA: Diagnosis not present

## 2020-12-20 DIAGNOSIS — D122 Benign neoplasm of ascending colon: Secondary | ICD-10-CM | POA: Diagnosis not present

## 2020-12-20 DIAGNOSIS — K635 Polyp of colon: Secondary | ICD-10-CM | POA: Diagnosis not present

## 2020-12-20 DIAGNOSIS — D120 Benign neoplasm of cecum: Secondary | ICD-10-CM | POA: Diagnosis not present

## 2020-12-20 DIAGNOSIS — Z1211 Encounter for screening for malignant neoplasm of colon: Secondary | ICD-10-CM | POA: Diagnosis not present

## 2021-02-06 DIAGNOSIS — R972 Elevated prostate specific antigen [PSA]: Secondary | ICD-10-CM | POA: Diagnosis not present

## 2021-02-06 DIAGNOSIS — E782 Mixed hyperlipidemia: Secondary | ICD-10-CM | POA: Diagnosis not present

## 2021-02-06 DIAGNOSIS — I10 Essential (primary) hypertension: Secondary | ICD-10-CM | POA: Diagnosis not present

## 2021-02-06 DIAGNOSIS — Z6835 Body mass index (BMI) 35.0-35.9, adult: Secondary | ICD-10-CM | POA: Diagnosis not present

## 2021-02-06 DIAGNOSIS — J309 Allergic rhinitis, unspecified: Secondary | ICD-10-CM | POA: Diagnosis not present

## 2021-02-06 DIAGNOSIS — Z1389 Encounter for screening for other disorder: Secondary | ICD-10-CM | POA: Diagnosis not present

## 2021-02-06 DIAGNOSIS — R7303 Prediabetes: Secondary | ICD-10-CM | POA: Diagnosis not present

## 2021-02-06 DIAGNOSIS — Z0000 Encounter for general adult medical examination without abnormal findings: Secondary | ICD-10-CM | POA: Diagnosis not present

## 2021-02-06 DIAGNOSIS — Z8679 Personal history of other diseases of the circulatory system: Secondary | ICD-10-CM | POA: Diagnosis not present

## 2021-03-13 ENCOUNTER — Ambulatory Visit: Payer: Federal, State, Local not specified - PPO | Admitting: Orthopedic Surgery

## 2021-03-13 ENCOUNTER — Encounter: Payer: Self-pay | Admitting: Physician Assistant

## 2021-03-13 ENCOUNTER — Ambulatory Visit (INDEPENDENT_AMBULATORY_CARE_PROVIDER_SITE_OTHER): Payer: Medicare Other | Admitting: Physician Assistant

## 2021-03-13 DIAGNOSIS — B351 Tinea unguium: Secondary | ICD-10-CM

## 2021-03-13 NOTE — Progress Notes (Signed)
Office Visit Note   Patient: Michael Moore           Date of Birth: 21-Jul-1951           MRN: [id number] Visit Date: 03/13/2021              Requested by: Wenda Low, MD [address] [place] [address] [place],  [place] [address] PCP: Wenda Low, MD  Chief Complaint  Patient presents with  . Right Leg - Follow-up  . Left Leg - Follow-up      HPI: Patient is a pleasant 70 year old gentleman who comes in periodically for trimming of his toenails.  He has no other complaints today.  Assessment & Plan: Visit Diagnoses: No diagnosis found.  Plan: Onychomycotic nails were trimmed x9 Follow-Up Instructions: No follow-ups on file.   Ortho Exam  Patient is alert, oriented, no adenopathy, well-dressed, normal affect, normal respiratory effort. Examination bilateral feet are overall in good condition.  There is no open ulcers no erythema no cellulitis.  No swelling.  No evidence of any infective process  Imaging: No results found. No images are attached to the encounter.  Labs: No results found for: HGBA1C, ESRSEDRATE, CRP, LABURIC, REPTSTATUS, GRAMSTAIN, CULT, LABORGA   Lab Results  Component Value Date   ALBUMIN 3.7 08/11/2014    No results found for: MG No results found for: VD25OH  No results found for: PREALBUMIN CBC EXTENDED Latest Ref Rng & Units 07/23/2020 08/27/2014 08/26/2014  WBC 4.0 - 10.5 K/uL 7.3 7.7 8.7  RBC 4.22 - 5.81 MIL/uL 4.63 3.90(L) 3.86(L)  HGB 13.0 - 17.0 g/dL 14.5 12.0(L) 12.0(L)  HCT 39.0 - 52.0 % 43.1 35.7(L) 34.9(L)  PLT 150 - 400 K/uL 183 178 179  NEUTROABS - - - -  LYMPHSABS - - - -     There is no height or weight on file to calculate BMI.  Orders:  No orders of the defined types were placed in this encounter.  No orders of the defined types were placed in this encounter.    Procedures: No procedures performed  Clinical Data: No additional findings.  ROS:  All other systems negative, except as noted in the  HPI. Review of Systems  Objective: Vital Signs: There were no vitals taken for this visit.  Specialty Comments:  No specialty comments available.  PMFS History: Patient Active Problem List   Diagnosis Date Noted  . 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 Essential hypertension 04/13/2014  . History of aortic valve replacement 04/13/2014  . Status post left foot surgery 04/13/2014   Past Medical History:  Diagnosis Date  . Arthritis    right hip  . Dysrhythmia    palpitations  . 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 Hypertension   . Umbilical hernia     Family History  Problem Relation Age of Onset  . Heart attack Mother   . Heart attack Maternal Grandmother   . Heart attack Maternal Grandfather   . Stroke Paternal Grandmother   . Stroke Paternal Grandfather   . Heart attack Brother   . Heart attack Sister     Past Surgical History:  Procedure Laterality Date  . AORTIC ROOT REPLACEMENT  04-07-2007   aortic dissection repair and AVR  . COLONOSCOPY    . FOOT SURGERY  09-03-2005  . HERNIA REPAIR     left in 03/1984; right in 03/1977  . TOTAL HIP ARTHROPLASTY Right 08/25/2014  Procedure: TOTAL HIP ARTHROPLASTY;  Surgeon: Newt Minion, MD;  Location: [facility];  Service: Orthopedics;  Laterality: Right;  Right Total Hip Arthroplasty   Social History   Occupational History  . Not on file  Tobacco Use  . Smoking status: Never Smoker  . Smokeless tobacco: Never Used  Substance and Sexual Activity  . Alcohol use: Yes    Comment: wine occasional  . Drug use: No  . Sexual activity: Not on file

## 2021-03-24 DIAGNOSIS — Z953 Presence of xenogenic heart valve: Secondary | ICD-10-CM | POA: Diagnosis not present

## 2021-03-24 DIAGNOSIS — Z6835 Body mass index (BMI) 35.0-35.9, adult: Secondary | ICD-10-CM | POA: Diagnosis not present

## 2021-03-24 DIAGNOSIS — Z7982 Long term (current) use of aspirin: Secondary | ICD-10-CM | POA: Diagnosis not present

## 2021-03-24 DIAGNOSIS — I119 Hypertensive heart disease without heart failure: Secondary | ICD-10-CM | POA: Diagnosis not present

## 2021-03-24 DIAGNOSIS — Z8679 Personal history of other diseases of the circulatory system: Secondary | ICD-10-CM | POA: Diagnosis not present

## 2021-03-24 DIAGNOSIS — I10 Essential (primary) hypertension: Secondary | ICD-10-CM | POA: Diagnosis not present

## 2021-03-24 DIAGNOSIS — Z6836 Body mass index (BMI) 36.0-36.9, adult: Secondary | ICD-10-CM | POA: Diagnosis not present

## 2021-03-24 DIAGNOSIS — Z952 Presence of prosthetic heart valve: Secondary | ICD-10-CM | POA: Diagnosis not present

## 2021-03-24 DIAGNOSIS — I517 Cardiomegaly: Secondary | ICD-10-CM | POA: Diagnosis not present

## 2021-03-24 DIAGNOSIS — E7800 Pure hypercholesterolemia, unspecified: Secondary | ICD-10-CM | POA: Diagnosis not present

## 2021-03-24 DIAGNOSIS — I790 Aneurysm of aorta in diseases classified elsewhere: Secondary | ICD-10-CM | POA: Diagnosis not present

## 2021-05-18 DIAGNOSIS — R972 Elevated prostate specific antigen [PSA]: Secondary | ICD-10-CM | POA: Diagnosis not present

## 2021-05-25 DIAGNOSIS — R972 Elevated prostate specific antigen [PSA]: Secondary | ICD-10-CM | POA: Diagnosis not present

## 2021-05-25 DIAGNOSIS — N5201 Erectile dysfunction due to arterial insufficiency: Secondary | ICD-10-CM | POA: Diagnosis not present

## 2021-05-25 DIAGNOSIS — N400 Enlarged prostate without lower urinary tract symptoms: Secondary | ICD-10-CM | POA: Diagnosis not present

## 2021-06-12 ENCOUNTER — Encounter: Payer: Self-pay | Admitting: Orthopedic Surgery

## 2021-06-12 ENCOUNTER — Other Ambulatory Visit: Payer: Self-pay

## 2021-06-12 ENCOUNTER — Ambulatory Visit (INDEPENDENT_AMBULATORY_CARE_PROVIDER_SITE_OTHER): Payer: Medicare Other | Admitting: Physician Assistant

## 2021-06-12 VITALS — Ht 72.0 in | Wt 230.0 lb

## 2021-06-12 DIAGNOSIS — B351 Tinea unguium: Secondary | ICD-10-CM

## 2021-06-12 NOTE — Progress Notes (Signed)
Office Visit Note   Patient: Michael Moore           Date of Birth: [birth year]           MRN: [id number] Visit Date: 06/12/2021              Requested by: Wenda Low, MD [address] [place],   [address] PCP: Wenda Low, MD  Chief Complaint  Patient presents with   toenail trim      HPI: Patient presents today with a history of onychomycotic nails.  He has these periodically trimmed.  No other complaints.  Assessment & Plan: Visit Diagnoses: No diagnosis found.  Plan: Onychomycotic nails trimmed x9 to stable surfaces follow-up in 3 months  Follow-Up Instructions: No follow-ups on file.   Ortho Exam  Patient is alert, oriented, no adenopathy, well-dressed, normal affect, normal respiratory effort. Bilateral feet onychomycotic nails.  He has no swelling feet are warm no necrosis no open sores or ulcers no signs of any infection  Imaging: No results found. No images are attached to the encounter.  Labs: No results found for: HGBA1C, ESRSEDRATE, CRP, LABURIC, REPTSTATUS, GRAMSTAIN, CULT, LABORGA   Lab Results  Component Value Date   ALBUMIN 3.7 08/11/2014    No results found for: MG No results found for: VD25OH  No results found for: PREALBUMIN CBC EXTENDED Latest Ref Rng & Units 07/23/2020 08/27/2014 08/26/2014  WBC 4.0 - 10.5 K/uL 7.3 7.7 8.7  RBC 4.22 - 5.81 MIL/uL 4.63 3.90(L) 3.86(L)  HGB 13.0 - 17.0 g/dL 14.5 12.0(L) 12.0(L)  HCT 39.0 - 52.0 % 43.1 35.7(L) 34.9(L)  PLT 150 - 400 K/uL 183 178 179  NEUTROABS - - - -  LYMPHSABS - - - -     Body mass index is 31.19 kg/m.  Orders:  No orders of the defined types were placed in this encounter.  No orders of the defined types were placed in this encounter.    Procedures: No procedures performed  Clinical Data: No additional findings.  ROS:  All other systems negative, except as noted in the HPI. Review of Systems  Objective: Vital Signs: Ht 6' (1.829 m)   Wt  230 lb (104.3 kg)   BMI 31.19 kg/m   Specialty Comments:  No specialty comments available.  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   Hypertension    Umbilical hernia     Family History  Problem Relation Age of Onset   Heart attack Mother    Heart attack Maternal Grandmother    Heart attack Maternal Grandfather    Stroke Paternal Grandmother    Stroke Paternal Grandfather    Heart attack Brother    Heart attack Sister     Past Surgical History:  Procedure Laterality Date   AORTIC ROOT REPLACEMENT  04-07-2007   aortic dissection repair and AVR   COLONOSCOPY     FOOT SURGERY  09-03-2005   HERNIA REPAIR     left in 03/1984; right in 03/1977   TOTAL HIP ARTHROPLASTY Right 08/25/2014   Procedure: TOTAL  HIP ARTHROPLASTY;  Surgeon: Newt Minion, MD;  Location: [facility];  Service: Orthopedics;  Laterality: Right;  Right Total Hip Arthroplasty   Social History   Occupational History   Not on file  Tobacco Use   Smoking status: Never   Smokeless tobacco: Never  Substance and Sexual Activity   Alcohol use: Yes    Comment: wine occasional   Drug use: No   Sexual activity: Not on file

## 2021-08-10 ENCOUNTER — Ambulatory Visit: Payer: Self-pay

## 2021-08-10 ENCOUNTER — Encounter: Payer: Self-pay | Admitting: Orthopedic Surgery

## 2021-08-10 ENCOUNTER — Telehealth: Payer: Self-pay | Admitting: Orthopedic Surgery

## 2021-08-10 ENCOUNTER — Ambulatory Visit (INDEPENDENT_AMBULATORY_CARE_PROVIDER_SITE_OTHER): Payer: Medicare Other | Admitting: Orthopedic Surgery

## 2021-08-10 VITALS — BP 122/68 | HR 62 | Ht 72.0 in | Wt 249.0 lb

## 2021-08-10 DIAGNOSIS — M79632 Pain in left forearm: Secondary | ICD-10-CM | POA: Diagnosis not present

## 2021-08-10 NOTE — Telephone Encounter (Signed)
Patient called. He would like to know what the xray showed. His call back number is [phone]

## 2021-08-10 NOTE — Progress Notes (Signed)
Office Visit Note   Patient: Michael Moore           Date of Birth: 11-09-1950           MRN: [id number] Visit Date: 08/10/2021              Requested by: Wenda Low, MD [address] [place] [address] [place],  [place] [address] PCP: Wenda Low, MD   Assessment & Plan: Visit Diagnoses:  1. Left forearm pain     Plan: Discussed with patient that his atraumatic erythema of his proximal forearm could be from superficial thrombophlebitis.  We also discussed that it could be early cellulitis, though he has no scrapes, cuts, or breaks in the skin.  The area is not painful.  He denies any systemic symptoms.  We discussed applying a warm compress to the area and taking anti-inflammatory medication.  He is aware of the warning signs that this could be early cellulitis including worsening or spreading erythema, increased pain, or systemic symptoms.  He is going to monitor his symptoms over the next few days and call the office if his symptoms aren't improving.  Follow-Up Instructions: No follow-ups on file.   Orders:  Orders Placed This Encounter  Procedures   XR Forearm Left   No orders of the defined types were placed in this encounter.     Procedures: No procedures performed   Clinical Data: No additional findings.   Subjective: Chief Complaint  Patient presents with   Left Forearm - Follow-up    This is a 70 year old right-hand-dominant male who presents with left forearm erythema and swelling for last 2 or 3 days.  This came out of the blue.  He denies any trauma.  Denies any cuts scrapes or other breaks in the skin of this area.  He denies any falls or injuries.  The area is not tender to touch.  He does feel some slight subjective tightness in his proximal forearm.  He denies any numbness or paresthesias in his hands or fingers.  He denies any previous similar episodes.  He denies any history of DVTs or clots.  He denies any systemic symptoms.  The erythema is localized  to the proximal and ulnar aspect of the forearm.   Review of Systems  Constitutional: Negative.   Respiratory: Negative.    Cardiovascular: Negative.   Musculoskeletal: Negative.   Skin:  Positive for color change.    Objective: Vital Signs: BP 122/68 (BP Location: Right Arm, Patient Position: Sitting, Cuff Size: Large)   Pulse 62   Ht 6' (1.829 m)   Wt 249 lb (112.9 kg)   SpO2 92%   BMI 33.77 kg/m   Physical Exam Constitutional:      Appearance: Normal appearance.  Cardiovascular:     Rate and Rhythm: Normal rate.     Pulses: Normal pulses.  Pulmonary:     Effort: Pulmonary effort is normal.  Skin:    General: Skin is warm and dry.     Capillary Refill: Capillary refill takes less than 2 seconds.  Neurological:     Mental Status: He is alert.    Left Hand Exam  Left hand exam is normal.  Comments:  Mild erythema of proximal forearm with slight underlying tightness.  Compartments are all soft and compressible.  No cuts, scrapes, or breaks in skin.    Left Elbow Exam   Tenderness  The patient is experiencing no tenderness.   Range of Motion  The patient  has normal left elbow ROM.  Muscle Strength  The patient has normal left elbow strength.  Other  Sensation: normal Pulse: present     Specialty Comments:  No specialty comments available.  Imaging: 2 views of the left forearm taken today are reviewed interpreted by me.  There is no obvious bony abnormality.  There is a soft tissue calcification at the ulnar aspect of the proximal forearm.  This could represent a phlebolith.   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   Essential hypertension 04/13/2014   History of aortic valve replacement 04/13/2014   Status post left foot surgery 04/13/2014   Past Medical History:  Diagnosis Date   Arthritis    right hip   Dysrhythmia    palpitations   Hx of aortic root repair    dissection  repair and AVR in 2008   Hypertension    Umbilical hernia     Family History  Problem Relation Age of Onset   Heart attack Mother    Heart attack Maternal Grandmother    Heart attack Maternal Grandfather    Stroke Paternal Grandmother    Stroke Paternal Grandfather    Heart attack Brother    Heart attack Sister     Past Surgical History:  Procedure Laterality Date   AORTIC ROOT REPLACEMENT  04-07-2007   aortic dissection repair and AVR   COLONOSCOPY     FOOT SURGERY  09-03-2005   HERNIA REPAIR     left in 03/1984; right in 03/1977   TOTAL HIP ARTHROPLASTY Right 08/25/2014   Procedure: TOTAL HIP ARTHROPLASTY;  Surgeon: Newt Minion, MD;  Location: [facility];  Service: Orthopedics;  Laterality: Right;  Right Total Hip Arthroplasty   Social History   Occupational History   Not on file  Tobacco Use   Smoking status: Never   Smokeless tobacco: Never  Substance and Sexual Activity   Alcohol use: Yes    Comment: wine occasional   Drug use: No   Sexual activity: Not on file

## 2021-08-10 NOTE — Telephone Encounter (Signed)
I called and read the note from todays office visit

## 2021-08-11 DIAGNOSIS — H2513 Age-related nuclear cataract, bilateral: Secondary | ICD-10-CM | POA: Diagnosis not present

## 2021-08-11 DIAGNOSIS — H31012 Macula scars of posterior pole (postinflammatory) (post-traumatic), left eye: Secondary | ICD-10-CM | POA: Diagnosis not present

## 2021-09-18 ENCOUNTER — Ambulatory Visit (INDEPENDENT_AMBULATORY_CARE_PROVIDER_SITE_OTHER): Payer: Medicare Other | Admitting: Orthopedic Surgery

## 2021-09-18 ENCOUNTER — Other Ambulatory Visit: Payer: Self-pay

## 2021-09-18 DIAGNOSIS — B351 Tinea unguium: Secondary | ICD-10-CM | POA: Diagnosis not present

## 2021-09-18 DIAGNOSIS — I872 Venous insufficiency (chronic) (peripheral): Secondary | ICD-10-CM

## 2021-09-19 ENCOUNTER — Encounter: Payer: Self-pay | Admitting: Orthopedic Surgery

## 2021-09-19 NOTE — Progress Notes (Signed)
Office Visit Note   Patient: Michael Moore           Date of Birth: February 17, 1951           MRN: [id number] Visit Date: 09/18/2021              Requested by: Wenda Low, MD [address] [place] [address] [place],  [place] [address] PCP: Wenda Low, MD  Chief Complaint  Patient presents with   Right Foot - Follow-up    Nail trim   Left Foot - Follow-up    Nail trim      HPI: Patient is a 70 year old gentleman who presents in follow-up for onychomycotic nails as well as a callus beneath the left foot.  Patient also has chronic venous insufficiency.  Assessment & Plan: Visit Diagnoses:  1. Onychomycosis   2. Venous stasis dermatitis of both lower extremities     Plan: Nails were trimmed x10 callus pared x1.  Follow-Up Instructions: Return in about 3 months (around 12/19/2021).   Ortho Exam  Patient is alert, oriented, no adenopathy, well-dressed, normal affect, normal respiratory effort. Examination there is no venous stasis ulcers there are no plantar ulcers patient has thickened discolored onychomycotic nails x10 he is unable to safely trim the nails on his own and the nails were trimmed G26 without complications.  There was one callus beneath the metatarsal heads of the left foot this was pared without complications.  Imaging: No results found. No images are attached to the encounter.  Labs: No results found for: HGBA1C, ESRSEDRATE, CRP, LABURIC, REPTSTATUS, GRAMSTAIN, CULT, LABORGA   Lab Results  Component Value Date   ALBUMIN 3.7 08/11/2014    No results found for: MG No results found for: VD25OH  No results found for: PREALBUMIN CBC EXTENDED Latest Ref Rng & Units 07/23/2020 08/27/2014 08/26/2014  WBC 4.0 - 10.5 K/uL 7.3 7.7 8.7  RBC 4.22 - 5.81 MIL/uL 4.63 3.90(L) 3.86(L)  HGB 13.0 - 17.0 g/dL 14.5 12.0(L) 12.0(L)  HCT 39.0 - 52.0 % 43.1 35.7(L) 34.9(L)  PLT 150 - 400 K/uL 183 178 179  NEUTROABS - - - -  LYMPHSABS - - - -     There is no height  or weight on file to calculate BMI.  Orders:  No orders of the defined types were placed in this encounter.  No orders of the defined types were placed in this encounter.    Procedures: No procedures performed  Clinical Data: No additional findings.  ROS:  All other systems negative, except as noted in the HPI. Review of Systems  Objective: Vital Signs: There were no vitals taken for this visit.  Specialty Comments:  No specialty comments available.  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   Hypertension    Umbilical hernia     Family History  Problem Relation Age of Onset   Heart attack Mother    Heart attack Maternal Grandmother    Heart attack Maternal Grandfather    Stroke Paternal Grandmother    Stroke Paternal Grandfather    Heart attack Brother  Heart attack Sister     Past Surgical History:  Procedure Laterality Date   AORTIC ROOT REPLACEMENT  04-07-2007   aortic dissection repair and AVR   COLONOSCOPY     FOOT SURGERY  09-03-2005   HERNIA REPAIR     left in 03/1984; right in 03/1977   TOTAL HIP ARTHROPLASTY Right 08/25/2014   Procedure: TOTAL HIP ARTHROPLASTY;  Surgeon: Newt Minion, MD;  Location: [facility];  Service: Orthopedics;  Laterality: Right;  Right Total Hip Arthroplasty   Social History   Occupational History   Not on file  Tobacco Use   Smoking status: Never   Smokeless tobacco: Never  Substance and Sexual Activity   Alcohol use: Yes    Comment: wine occasional   Drug use: No   Sexual activity: Not on file

## 2021-10-03 ENCOUNTER — Other Ambulatory Visit: Payer: Self-pay

## 2021-10-03 ENCOUNTER — Encounter (HOSPITAL_BASED_OUTPATIENT_CLINIC_OR_DEPARTMENT_OTHER): Payer: Self-pay | Admitting: Emergency Medicine

## 2021-10-03 ENCOUNTER — Emergency Department (HOSPITAL_BASED_OUTPATIENT_CLINIC_OR_DEPARTMENT_OTHER)
Admission: EM | Admit: 2021-10-03 | Discharge: 2021-10-03 | Disposition: A | Discharge status: Home / Self Care | Payer: Medicare Other | Attending: Emergency Medicine | Admitting: Emergency Medicine

## 2021-10-03 DIAGNOSIS — I83899 Varicose veins of unspecified lower extremities with other complications: Secondary | ICD-10-CM | POA: Diagnosis present

## 2021-10-03 DIAGNOSIS — Z79899 Other long term (current) drug therapy: Secondary | ICD-10-CM | POA: Diagnosis not present

## 2021-10-03 DIAGNOSIS — I10 Essential (primary) hypertension: Secondary | ICD-10-CM | POA: Diagnosis not present

## 2021-10-03 DIAGNOSIS — Z96641 Presence of right artificial hip joint: Secondary | ICD-10-CM | POA: Insufficient documentation

## 2021-10-03 DIAGNOSIS — R0902 Hypoxemia: Secondary | ICD-10-CM | POA: Diagnosis not present

## 2021-10-03 DIAGNOSIS — I83891 Varicose veins of right lower extremities with other complications: Secondary | ICD-10-CM | POA: Insufficient documentation

## 2021-10-03 DIAGNOSIS — Z7982 Long term (current) use of aspirin: Secondary | ICD-10-CM | POA: Insufficient documentation

## 2021-10-03 DIAGNOSIS — R58 Hemorrhage, not elsewhere classified: Secondary | ICD-10-CM | POA: Diagnosis not present

## 2021-10-03 MED ORDER — LIDOCAINE-EPINEPHRINE (PF) 2 %-1:200000 IJ SOLN
INTRAMUSCULAR | Status: AC
  Administered 2021-10-03: 10 mL
  Filled 2021-10-03: qty 20

## 2021-10-03 MED ORDER — LIDOCAINE-EPINEPHRINE (PF) 2 %-1:200000 IJ SOLN: 10.0000 mL | Freq: Once | INTRAMUSCULAR | Status: AC

## 2021-10-03 NOTE — ED Provider Notes (Signed)
[facility] Provider Note  CSN: [id number] Arrival date & time: 10/03/21 1517    History Chief Complaint  Patient presents with   Foot Injury    Michael Moore is a 70 y.o. male with history of varicose veins reports he was getting a pedicure earlier today when the technician bumped a varicose vein on his foot and it opened up and began bleeding. It had stopped on arrival here but began bleeding again while he was in the bathroom.    Past Medical History:  Diagnosis Date   Arthritis    right hip   Dysrhythmia    palpitations   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   Hypertension    Umbilical hernia     Past Surgical History:  Procedure Laterality Date   AORTIC ROOT REPLACEMENT  04-07-2007   aortic dissection repair and AVR   COLONOSCOPY     FOOT SURGERY  09-03-2005   HERNIA REPAIR     left in 03/1984; right in 03/1977   TOTAL HIP ARTHROPLASTY Right 08/25/2014   Procedure: TOTAL HIP ARTHROPLASTY;  Surgeon: Newt Minion, MD;  Location: [facility];  Service: Orthopedics;  Laterality: Right;  Right Total Hip Arthroplasty    Family History  Problem Relation Age of Onset   Heart attack Mother    Heart attack Maternal Grandmother    Heart attack Maternal Grandfather    Stroke Paternal Grandmother    Stroke Paternal Grandfather    Heart attack Brother    Heart attack Sister     Social History   Tobacco Use   Smoking status: Never   Smokeless tobacco: Never  Substance Use Topics   Alcohol use: Yes    Comment: wine occasional   Drug use: No     Home Medications Prior to Admission medications   Medication Sig Start Date End Date Taking? Authorizing Provider  aspirin 81 MG tablet Take 81 mg by mouth daily.    [provider]  aspirin EC 81 MG tablet Take 1 tablet (81 mg total) by mouth daily. 08/26/14   Newt Minion, MD  calcium citrate-vitamin D (CITRACAL+D) 315-200 MG-UNIT per tablet Take 1 tablet by  mouth daily.    [provider]  doxycycline (VIBRA-TABS) 100 MG tablet Take 1 tablet (100 mg total) by mouth 2 (two) times daily. 05/12/20   Newt Minion, MD  losartan-hydrochlorothiazide (HYZAAR) 100-25 MG per tablet Take 1 tablet by mouth daily.  03/23/14   [provider]  metoprolol succinate (TOPROL-XL) 25 MG 24 hr tablet Take 25 mg by mouth daily.  03/23/14   [provider]  Multiple Vitamins-Minerals (MULTIVITAMIN PO) Take 1 tablet by mouth daily.    [provider]  predniSONE (DELTASONE) 10 MG tablet TAKE 2 TABLETS BY MOUTH EVERY DAY 02/04/20   Persons, Bevely [place], [place]     Allergies    Patient has no known allergies.   Review of Systems   Review of Systems A comprehensive review of systems was completed and negative except as noted in HPI.    Physical Exam BP 136/65 (BP Location: Left Arm)   Pulse 62   Temp 98.5 F (36.9 C)   Resp 17   Ht 6' (1.829 m)   Wt 104.3 kg   SpO2 97%   BMI 31.19 kg/m   Physical Exam Vitals and nursing note reviewed.  HENT:     Head: Normocephalic.  Nose: Nose normal.  Eyes:     Extraocular Movements: Extraocular movements intact.  Pulmonary:     Effort: Pulmonary effort is normal.  Musculoskeletal:        General: Normal range of motion.     Cervical back: Neck supple.     Comments: Small area of bleeding from R lateral foot, pressure dressing applied.   Skin:    Findings: No rash (on exposed skin).  Neurological:     Mental Status: He is alert and oriented to person, place, and time.  Psychiatric:        Mood and Affect: Mood normal.     ED Results / Procedures / Treatments   Labs (all labs ordered are listed, but only abnormal results are displayed) Labs Reviewed - No data to display  EKG None   Radiology No results found.  Procedures Procedures  Medications Ordered in the ED Medications  lidocaine-EPINEPHrine (XYLOCAINE W/EPI) 2 %-1:200000 (PF) injection 10 mL (10 mLs  Infiltration Given 10/03/21 1748)     MDM Rules/Calculators/A&P MDM   ED Course  I have reviewed the triage vital signs and the nursing notes.  Pertinent labs & imaging results that were available during my care of the patient were reviewed by me and considered in my medical decision making (see chart for details).  Clinical Course as of 10/03/21 1911  Tue Oct 03, 2021  1850 Foot is no longer bleeding. Pressure dressing removed, RN to place a non-adherent dressing and will attempt ambulation to ensure bleeding does not start back.  [CS]    Clinical Course User Index [CS] Truddie Hidden, MD    Final Clinical Impression(s) / ED Diagnoses Final diagnoses:  Bleeding from varicose vein    Rx / DC Orders ED Discharge Orders     None        Truddie Hidden, MD 10/03/21 1911

## 2021-10-03 NOTE — ED Notes (Signed)
Patient got up and went to the bathroom.  Large puddle of blood noted in the floor.  Bleeding noted from vericose vein on right foot.  Pressure held.  Provider at the bedside.

## 2021-10-03 NOTE — ED Triage Notes (Signed)
Arrives vi EMS Pt having a pedicure and when  the lady rubbed his foot  his rt foot had a varicose vein burst , bleeding now controlled has a bandage , pt states has had this before. Denies bloodthinners he states

## 2022-01-01 DIAGNOSIS — R002 Palpitations: Secondary | ICD-10-CM | POA: Diagnosis not present

## 2022-01-01 DIAGNOSIS — I790 Aneurysm of aorta in diseases classified elsewhere: Secondary | ICD-10-CM | POA: Diagnosis not present

## 2022-01-01 DIAGNOSIS — Z8679 Personal history of other diseases of the circulatory system: Secondary | ICD-10-CM | POA: Diagnosis not present

## 2022-01-02 DIAGNOSIS — Z8679 Personal history of other diseases of the circulatory system: Secondary | ICD-10-CM | POA: Diagnosis not present

## 2022-01-02 DIAGNOSIS — R001 Bradycardia, unspecified: Secondary | ICD-10-CM | POA: Diagnosis not present

## 2022-01-15 ENCOUNTER — Ambulatory Visit (INDEPENDENT_AMBULATORY_CARE_PROVIDER_SITE_OTHER): Payer: Medicare Other | Admitting: Orthopedic Surgery

## 2022-01-15 DIAGNOSIS — I872 Venous insufficiency (chronic) (peripheral): Secondary | ICD-10-CM

## 2022-01-15 DIAGNOSIS — B351 Tinea unguium: Secondary | ICD-10-CM

## 2022-01-21 ENCOUNTER — Encounter: Payer: Self-pay | Admitting: Orthopedic Surgery

## 2022-01-21 NOTE — Progress Notes (Signed)
? ?Office Visit Note ?  ?Patient: Michael Moore           ?Date of Birth: [birth year]           ?MRN: [id number] ?Visit Date: 01/15/2022 ?             ?Requested by: Wenda Low, MD ?[address] ?[address] ?[place],  [place] [address] ?PCP: Wenda Low, MD ? ?Chief Complaint  ?Patient presents with  ? Left Foot - Follow-up  ? Right Foot - Follow-up  ? ? ? ? ?HPI: ?Patient is a 71 year old gentleman who is seen in follow-up for both lower extremities.  Patient complains of painful onychomycotic nails he is unable to safely trim. ? ?Assessment & Plan: ?Visit Diagnoses:  ?1. Onychomycosis   ?2. Venous stasis dermatitis of both lower extremities   ? ? ?Plan: Nails were trimmed x10. ? ?Follow-Up Instructions: Return in about 3 months (around 04/17/2022).  ? ?Ortho Exam ? ?Patient is alert, oriented, no adenopathy, well-dressed, normal affect, normal respiratory effort. ?Examination there are no plantar ulcers no venous stasis ulcers there is no cellulitis.  He has thickened discolored onychomycotic nails that he is unable to safely trim on his own.  The nails were trimmed A76 without complication. ? ?Imaging: ?No results found. ?No images are attached to the encounter. ? ?Labs: ?No results found for: HGBA1C, ESRSEDRATE, CRP, LABURIC, REPTSTATUS, GRAMSTAIN, CULT, LABORGA ? ? ?Lab Results  ?Component Value Date  ? ALBUMIN 3.7 08/11/2014  ? ? ?No results found for: MG ?No results found for: VD25OH ? ?No results found for: PREALBUMIN ? ?  Latest Ref Rng & Units 07/23/2020  ? 10:10 PM 08/27/2014  ?  6:10 AM 08/26/2014  ?  5:15 AM  ?CBC EXTENDED  ?WBC 4.0 - 10.5 K/uL 7.3   7.7   8.7    ?RBC 4.22 - 5.81 MIL/uL 4.63   3.90   3.86    ?Hemoglobin 13.0 - 17.0 g/dL 14.5   12.0   12.0    ?HCT 39.0 - 52.0 % 43.1   35.7   34.9    ?Platelets 150 - 400 K/uL 183   178   179    ? ? ? ?There is no height or weight on file to calculate BMI. ? ?Orders:  ?No orders of the defined types were placed in this encounter. ? ?No orders of the  defined types were placed in this encounter. ? ? ? Procedures: ?No procedures performed ? ?Clinical Data: ?No additional findings. ? ?ROS: ? ?All other systems negative, except as noted in the HPI. ?Review of Systems ? ?Objective: ?Vital Signs: There were no vitals taken for this visit. ? ?Specialty Comments:  ?No specialty comments available. ? ?PMFS History: ?Patient Active Problem List  ? Diagnosis Date Noted  ? 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 Essential hypertension 04/13/2014  ? History of aortic valve replacement 04/13/2014  ? Status post left foot surgery 04/13/2014  ? ?Past Medical History:  ?Diagnosis Date  ? Arthritis   ? right hip  ? Dysrhythmia   ? palpitations  ? Hx of aortic root repair   ? dissection repair and AVR in 2008  ? Hypertension   ? Umbilical hernia   ?  ?Family History  ?Problem Relation Age of Onset  ? Heart attack Mother   ? Heart attack Maternal Grandmother   ? Heart attack Maternal Grandfather   ? Stroke Paternal Grandmother   ? Stroke Paternal Grandfather   ?  Heart attack Brother   ? Heart attack Sister   ?  ?Past Surgical History:  ?Procedure Laterality Date  ? AORTIC ROOT REPLACEMENT  04-07-2007  ? aortic dissection repair and AVR  ? COLONOSCOPY    ? FOOT SURGERY  09-03-2005  ? HERNIA REPAIR    ? left in 03/1984; right in 03/1977  ? TOTAL HIP ARTHROPLASTY Right 08/25/2014  ? Procedure: TOTAL HIP ARTHROPLASTY;  Surgeon: Newt Minion, MD;  Location: [facility];  Service: Orthopedics;  Laterality: Right;  Right Total Hip Arthroplasty  ? ?Social History  ? ?Occupational History  ? Not on file  ?Tobacco Use  ? Smoking status: Never  ? Smokeless tobacco: Never  ?Substance and Sexual Activity  ? Alcohol use: Yes  ?  Comment: wine occasional  ? Drug use: No  ? Sexual activity: Not on file  ? ? ? ? ? ?

## 2022-03-09 DIAGNOSIS — Z0000 Encounter for general adult medical examination without abnormal findings: Secondary | ICD-10-CM | POA: Diagnosis not present

## 2022-03-09 DIAGNOSIS — Z1331 Encounter for screening for depression: Secondary | ICD-10-CM | POA: Diagnosis not present

## 2022-03-09 DIAGNOSIS — Z8679 Personal history of other diseases of the circulatory system: Secondary | ICD-10-CM | POA: Diagnosis not present

## 2022-03-09 DIAGNOSIS — I10 Essential (primary) hypertension: Secondary | ICD-10-CM | POA: Diagnosis not present

## 2022-03-09 DIAGNOSIS — Z6839 Body mass index (BMI) 39.0-39.9, adult: Secondary | ICD-10-CM | POA: Diagnosis not present

## 2022-03-09 DIAGNOSIS — R972 Elevated prostate specific antigen [PSA]: Secondary | ICD-10-CM | POA: Diagnosis not present

## 2022-03-09 DIAGNOSIS — J309 Allergic rhinitis, unspecified: Secondary | ICD-10-CM | POA: Diagnosis not present

## 2022-03-09 DIAGNOSIS — R7303 Prediabetes: Secondary | ICD-10-CM | POA: Diagnosis not present

## 2022-04-16 ENCOUNTER — Ambulatory Visit (INDEPENDENT_AMBULATORY_CARE_PROVIDER_SITE_OTHER): Payer: Medicare Other | Admitting: Orthopedic Surgery

## 2022-04-16 ENCOUNTER — Encounter: Payer: Self-pay | Admitting: Orthopedic Surgery

## 2022-04-16 DIAGNOSIS — I872 Venous insufficiency (chronic) (peripheral): Secondary | ICD-10-CM | POA: Diagnosis not present

## 2022-04-16 DIAGNOSIS — B351 Tinea unguium: Secondary | ICD-10-CM | POA: Diagnosis not present

## 2022-04-16 NOTE — Progress Notes (Signed)
Office Visit Note   Patient: Michael Moore           Date of Birth: 30-Jun-1951           MRN: [id number] Visit Date: 04/16/2022              Requested by: Wenda Low, MD [address] [place],  [place] [address] PCP: Wenda Low, MD  Chief Complaint  Patient presents with   Right Foot - Follow-up    Nail trim   Left Foot - Follow-up    Nail trim      HPI: Patient is a 71 year old gentleman who presents in follow-up for venous insufficiency onychomycosis and dry cracked skin on his feet.  Assessment & Plan: Visit Diagnoses:  1. Onychomycosis   2. Venous stasis dermatitis of both lower extremities     Plan: Recommend that he wear the Vive socks daily for the fungal skin changes.  Nails were trimmed x9.  No venous ulcers.  Follow-Up Instructions: Return in about 3 months (around 07/17/2022).   Ortho Exam  Patient is alert, oriented, no adenopathy, well-dressed, normal affect, normal respiratory effort. Examination there is no venous ulcers.  There is dry cracked skin on both feet consistent with a fungal rash.  He has thickened discolored onychomycotic nails x9 he is unable to safely trim them on his own and the nails were trimmed x9 without complications no signs of infection.  Imaging: No results found. No images are attached to the encounter.  Labs: No results found for: "HGBA1C", "ESRSEDRATE", "CRP", "LABURIC", "REPTSTATUS", "GRAMSTAIN", "CULT", "LABORGA"   Lab Results  Component Value Date   ALBUMIN 3.7 08/11/2014    No results found for: "MG" No results found for: "VD25OH"  No results found for: "PREALBUMIN"    Latest Ref Rng & Units 07/23/2020   10:10 PM 08/27/2014    6:10 AM 08/26/2014    5:15 AM  CBC EXTENDED  WBC 4.0 - 10.5 K/uL 7.3  7.7  8.7   RBC 4.22 - 5.81 MIL/uL 4.63  3.90  3.86   Hemoglobin 13.0 - 17.0 g/dL 14.5  12.0  12.0   HCT 39.0 - 52.0 % 43.1  35.7  34.9   Platelets 150 - 400 K/uL 183  178  179      There is  no height or weight on file to calculate BMI.  Orders:  No orders of the defined types were placed in this encounter.  No orders of the defined types were placed in this encounter.    Procedures: No procedures performed  Clinical Data: No additional findings.  ROS:  All other systems negative, except as noted in the HPI. Review of Systems  Objective: Vital Signs: There were no vitals taken for this visit.  Specialty Comments:  No specialty comments available.  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   Hypertension    Umbilical hernia     Family History  Problem Relation Age of Onset   Heart attack Mother    Heart attack Maternal Grandmother  Heart attack Maternal Grandfather    Stroke Paternal Grandmother    Stroke Paternal Grandfather    Heart attack Brother    Heart attack Sister     Past Surgical History:  Procedure Laterality Date   AORTIC ROOT REPLACEMENT  04-07-2007   aortic dissection repair and AVR   COLONOSCOPY     FOOT SURGERY  09-03-2005   HERNIA REPAIR     left in 03/1984; right in 03/1977   TOTAL HIP ARTHROPLASTY Right 08/25/2014   Procedure: TOTAL HIP ARTHROPLASTY;  Surgeon: Newt Minion, MD;  Location: [facility];  Service: Orthopedics;  Laterality: Right;  Right Total Hip Arthroplasty   Social History   Occupational History   Not on file  Tobacco Use   Smoking status: Never   Smokeless tobacco: Never  Substance and Sexual Activity   Alcohol use: Yes    Comment: wine occasional   Drug use: No   Sexual activity: Not on file

## 2022-05-16 DIAGNOSIS — R972 Elevated prostate specific antigen [PSA]: Secondary | ICD-10-CM | POA: Diagnosis not present

## 2022-05-28 DIAGNOSIS — N400 Enlarged prostate without lower urinary tract symptoms: Secondary | ICD-10-CM | POA: Diagnosis not present

## 2022-05-28 DIAGNOSIS — R972 Elevated prostate specific antigen [PSA]: Secondary | ICD-10-CM | POA: Diagnosis not present

## 2022-05-28 DIAGNOSIS — N5201 Erectile dysfunction due to arterial insufficiency: Secondary | ICD-10-CM | POA: Diagnosis not present

## 2022-07-17 ENCOUNTER — Encounter: Payer: Self-pay | Admitting: Orthopedic Surgery

## 2022-07-17 ENCOUNTER — Ambulatory Visit (INDEPENDENT_AMBULATORY_CARE_PROVIDER_SITE_OTHER): Payer: Medicare Other | Admitting: Orthopedic Surgery

## 2022-07-17 DIAGNOSIS — B351 Tinea unguium: Secondary | ICD-10-CM

## 2022-07-17 DIAGNOSIS — I872 Venous insufficiency (chronic) (peripheral): Secondary | ICD-10-CM

## 2022-07-17 NOTE — Progress Notes (Signed)
Office Visit Note   Patient: Michael Moore           Date of Birth: 04/11/1951           MRN: [id number] Visit Date: 07/17/2022              Requested by: Wenda Low, MD [address] [place],  [place] [address] PCP: Wenda Low, MD  Chief Complaint  Patient presents with   Right Foot - Follow-up   Left Foot - Follow-up      HPI: Patient is a 71 year old gentleman with venous insufficiency onychomycotic nails x10 and hypertrophic calluses on his feet.  Assessment & Plan: Visit Diagnoses:  1. Onychomycosis   2. Venous stasis dermatitis of both lower extremities     Plan: Callus was pared nails trimmed x10.  Follow-Up Instructions: Return in about 3 months (around 10/16/2022).   Ortho Exam  Patient is alert, oriented, no adenopathy, well-dressed, normal affect, normal respiratory effort. Examination there is a hypertrophic callus which was pared no open wound.  Patient has thickened discolored onychomycotic nails he is unable to safely trim on his own there is no paronychial infection.  The nails were trimmed T88 without complications.  Imaging: No results found. No images are attached to the encounter.  Labs: No results found for: "HGBA1C", "ESRSEDRATE", "CRP", "LABURIC", "REPTSTATUS", "GRAMSTAIN", "CULT", "LABORGA"   Lab Results  Component Value Date   ALBUMIN 3.7 08/11/2014    No results found for: "MG" No results found for: "VD25OH"  No results found for: "PREALBUMIN"    Latest Ref Rng & Units 07/23/2020   10:10 PM 08/27/2014    6:10 AM 08/26/2014    5:15 AM  CBC EXTENDED  WBC 4.0 - 10.5 K/uL 7.3  7.7  8.7   RBC 4.22 - 5.81 MIL/uL 4.63  3.90  3.86   Hemoglobin 13.0 - 17.0 g/dL 14.5  12.0  12.0   HCT 39.0 - 52.0 % 43.1  35.7  34.9   Platelets 150 - 400 K/uL 183  178  179      There is no height or weight on file to calculate BMI.  Orders:  No orders of the defined types were placed in this encounter.  No orders of the  defined types were placed in this encounter.    Procedures: No procedures performed  Clinical Data: No additional findings.  ROS:  All other systems negative, except as noted in the HPI. Review of Systems  Objective: Vital Signs: There were no vitals taken for this visit.  Specialty Comments:  No specialty comments available.  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   Hypertension    Umbilical hernia     Family History  Problem Relation Age of Onset   Heart attack Mother    Heart attack Maternal Grandmother    Heart attack Maternal Grandfather    Stroke Paternal Grandmother    Stroke Paternal Grandfather    Heart attack Brother    Heart attack Sister     Past Surgical History:  Procedure Laterality  Date   AORTIC ROOT REPLACEMENT  04-07-2007   aortic dissection repair and AVR   COLONOSCOPY     FOOT SURGERY  09-03-2005   HERNIA REPAIR     left in 03/1984; right in 03/1977   TOTAL HIP ARTHROPLASTY Right 08/25/2014   Procedure: TOTAL HIP ARTHROPLASTY;  Surgeon: Newt Minion, MD;  Location: [facility];  Service: Orthopedics;  Laterality: Right;  Right Total Hip Arthroplasty   Social History   Occupational History   Not on file  Tobacco Use   Smoking status: Never   Smokeless tobacco: Never  Substance and Sexual Activity   Alcohol use: Yes    Comment: wine occasional   Drug use: No   Sexual activity: Not on file

## 2022-08-17 DIAGNOSIS — H2513 Age-related nuclear cataract, bilateral: Secondary | ICD-10-CM | POA: Diagnosis not present

## 2022-08-17 DIAGNOSIS — H31012 Macula scars of posterior pole (postinflammatory) (post-traumatic), left eye: Secondary | ICD-10-CM | POA: Diagnosis not present

## 2022-09-14 DIAGNOSIS — R972 Elevated prostate specific antigen [PSA]: Secondary | ICD-10-CM | POA: Diagnosis not present

## 2022-10-02 DIAGNOSIS — Z95828 Presence of other vascular implants and grafts: Secondary | ICD-10-CM | POA: Diagnosis not present

## 2022-10-02 DIAGNOSIS — I517 Cardiomegaly: Secondary | ICD-10-CM | POA: Diagnosis not present

## 2022-10-02 DIAGNOSIS — Z8679 Personal history of other diseases of the circulatory system: Secondary | ICD-10-CM | POA: Diagnosis not present

## 2022-10-02 DIAGNOSIS — I10 Essential (primary) hypertension: Secondary | ICD-10-CM | POA: Diagnosis not present

## 2022-10-02 DIAGNOSIS — Z952 Presence of prosthetic heart valve: Secondary | ICD-10-CM | POA: Diagnosis not present

## 2022-10-02 DIAGNOSIS — Z79899 Other long term (current) drug therapy: Secondary | ICD-10-CM | POA: Diagnosis not present

## 2022-10-02 DIAGNOSIS — I119 Hypertensive heart disease without heart failure: Secondary | ICD-10-CM | POA: Diagnosis not present

## 2022-10-16 ENCOUNTER — Ambulatory Visit: Payer: Federal, State, Local not specified - PPO | Admitting: Orthopedic Surgery

## 2022-11-01 ENCOUNTER — Ambulatory Visit: Payer: Medicare Other | Admitting: Orthopedic Surgery

## 2022-11-19 ENCOUNTER — Ambulatory Visit (INDEPENDENT_AMBULATORY_CARE_PROVIDER_SITE_OTHER): Payer: Medicare Other | Admitting: Orthopedic Surgery

## 2022-11-19 DIAGNOSIS — I872 Venous insufficiency (chronic) (peripheral): Secondary | ICD-10-CM

## 2022-11-19 DIAGNOSIS — B351 Tinea unguium: Secondary | ICD-10-CM | POA: Diagnosis not present

## 2022-11-20 ENCOUNTER — Encounter: Payer: Self-pay | Admitting: Orthopedic Surgery

## 2022-11-20 NOTE — Progress Notes (Signed)
Office Visit Note   Patient: Michael Moore           Date of Birth: [birth year]           MRN: [id number] Visit Date: 11/19/2022              Requested by: Wenda Low, MD [address] [place],  [place] [address] PCP: Wenda Low, MD  Chief Complaint  Patient presents with   Right Foot - Follow-up   Left Foot - Follow-up      HPI: Patient is a 72 year old gentleman who presents for evaluation of both lower extremity.  He comes planes of painful onychomycotic nails as well as calluses on the plantar aspect of his feet.  Assessment & Plan: Visit Diagnoses:  1. Onychomycosis   2. Venous stasis dermatitis of both lower extremities     Plan: Nails were trimmed x 9 calluses pared left foot.  Follow-Up Instructions: Return in about 3 months (around 02/18/2023).   Ortho Exam  Patient is alert, oriented, no adenopathy, well-dressed, normal affect, normal respiratory effort. Examination patient has venous stasis swelling of both legs but no open ulcers.  He has thickened discolored onychomycotic nails that he is unable to safely trim on his own and the nails were trimmed x 9 without complications.  He has a large callus on the plantar aspect the left foot this was pared without complication there is no open wounds.  Imaging: No results found. No images are attached to the encounter.  Labs: No results found for: "HGBA1C", "ESRSEDRATE", "CRP", "LABURIC", "REPTSTATUS", "GRAMSTAIN", "CULT", "LABORGA"   Lab Results  Component Value Date   ALBUMIN 3.7 08/11/2014    No results found for: "MG" No results found for: "VD25OH"  No results found for: "PREALBUMIN"    Latest Ref Rng & Units 07/23/2020   10:10 PM 08/27/2014    6:10 AM 08/26/2014    5:15 AM  CBC EXTENDED  WBC 4.0 - 10.5 K/uL 7.3  7.7  8.7   RBC 4.22 - 5.81 MIL/uL 4.63  3.90  3.86   Hemoglobin 13.0 - 17.0 g/dL 14.5  12.0  12.0   HCT 39.0 - 52.0 % 43.1  35.7  34.9   Platelets 150 - 400 K/uL 183   178  179      There is no height or weight on file to calculate BMI.  Orders:  No orders of the defined types were placed in this encounter.  No orders of the defined types were placed in this encounter.    Procedures: No procedures performed  Clinical Data: No additional findings.  ROS:  All other systems negative, except as noted in the HPI. Review of Systems  Objective: Vital Signs: There were no vitals taken for this visit.  Specialty Comments:  No specialty comments available.  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   Hypertension    Umbilical hernia     Family History  Problem Relation Age of Onset   Heart attack Mother    Heart attack Maternal Grandmother  Heart attack Maternal Grandfather    Stroke Paternal Grandmother    Stroke Paternal Grandfather    Heart attack Brother    Heart attack Sister     Past Surgical History:  Procedure Laterality Date   AORTIC ROOT REPLACEMENT  04-07-2007   aortic dissection repair and AVR   COLONOSCOPY     FOOT SURGERY  09-03-2005   HERNIA REPAIR     left in 03/1984; right in 03/1977   TOTAL HIP ARTHROPLASTY Right 08/25/2014   Procedure: TOTAL HIP ARTHROPLASTY;  Surgeon: Newt Minion, MD;  Location: [facility];  Service: Orthopedics;  Laterality: Right;  Right Total Hip Arthroplasty   Social History   Occupational History   Not on file  Tobacco Use   Smoking status: Never   Smokeless tobacco: Never  Substance and Sexual Activity   Alcohol use: Yes    Comment: wine occasional   Drug use: No   Sexual activity: Not on file

## 2022-12-26 DIAGNOSIS — I790 Aneurysm of aorta in diseases classified elsewhere: Secondary | ICD-10-CM | POA: Diagnosis not present

## 2022-12-26 DIAGNOSIS — Z8679 Personal history of other diseases of the circulatory system: Secondary | ICD-10-CM | POA: Diagnosis not present

## 2022-12-26 DIAGNOSIS — I7103 Dissection of thoracoabdominal aorta: Secondary | ICD-10-CM | POA: Diagnosis not present

## 2023-01-24 DIAGNOSIS — R972 Elevated prostate specific antigen [PSA]: Secondary | ICD-10-CM | POA: Diagnosis not present

## 2023-02-18 ENCOUNTER — Ambulatory Visit: Payer: Federal, State, Local not specified - PPO | Admitting: Orthopedic Surgery

## 2023-02-18 DIAGNOSIS — R3912 Poor urinary stream: Secondary | ICD-10-CM | POA: Diagnosis not present

## 2023-02-18 DIAGNOSIS — R972 Elevated prostate specific antigen [PSA]: Secondary | ICD-10-CM | POA: Diagnosis not present

## 2023-02-18 DIAGNOSIS — N5201 Erectile dysfunction due to arterial insufficiency: Secondary | ICD-10-CM | POA: Diagnosis not present

## 2023-02-18 DIAGNOSIS — N401 Enlarged prostate with lower urinary tract symptoms: Secondary | ICD-10-CM | POA: Diagnosis not present

## 2023-02-21 ENCOUNTER — Ambulatory Visit (INDEPENDENT_AMBULATORY_CARE_PROVIDER_SITE_OTHER): Payer: Medicare Other | Admitting: Orthopedic Surgery

## 2023-02-21 ENCOUNTER — Encounter: Payer: Self-pay | Admitting: Orthopedic Surgery

## 2023-02-21 DIAGNOSIS — B351 Tinea unguium: Secondary | ICD-10-CM

## 2023-02-21 DIAGNOSIS — I872 Venous insufficiency (chronic) (peripheral): Secondary | ICD-10-CM | POA: Diagnosis not present

## 2023-02-21 NOTE — Progress Notes (Signed)
Office Visit Note   Patient: Michael Moore           Date of Birth: 08-18-1951           MRN: [id number] Visit Date: 02/21/2023              Requested by: Georgann Housekeeper, MD [address] [place],  [place] [address] PCP: Georgann Housekeeper, MD  Chief Complaint  Patient presents with   Right Foot - Follow-up    Bilateral toenail trimming   Left Foot - Follow-up      HPI: Patient is a 72 year old gentleman who presents in follow-up for both lower extremities.  History of onychomycotic nails and venous insufficiency.  Assessment & Plan: Visit Diagnoses:  1. Onychomycosis   2. Venous stasis dermatitis of both lower extremities     Plan: Nails were trimmed x 9 without complication.  No venous ulcers.  Follow-Up Instructions: Return in about 3 months (around 05/23/2023).   Ortho Exam  Patient is alert, oriented, no adenopathy, well-dressed, normal affect, normal respiratory effort. Examination there is venous swelling but no ulceration.  Patient has no plantar ulcers.  Patient has thickened discolored onychomycotic nails that is unable to safely trim on his own and nails were trimmed x 9 without complication.  Imaging: No results found. No images are attached to the encounter.  Labs: No results found for: "HGBA1C", "ESRSEDRATE", "CRP", "LABURIC", "REPTSTATUS", "GRAMSTAIN", "CULT", "LABORGA"   Lab Results  Component Value Date   ALBUMIN 3.7 08/11/2014    No results found for: "MG" No results found for: "VD25OH"  No results found for: "PREALBUMIN"    Latest Ref Rng & Units 07/23/2020   10:10 PM 08/27/2014    6:10 AM 08/26/2014    5:15 AM  CBC EXTENDED  WBC 4.0 - 10.5 K/uL 7.3  7.7  8.7   RBC 4.22 - 5.81 MIL/uL 4.63  3.90  3.86   Hemoglobin 13.0 - 17.0 g/dL 19.1  47.8  29.5   HCT 39.0 - 52.0 % 43.1  35.7  34.9   Platelets 150 - 400 K/uL 183  178  179      There is no height or weight on file to calculate BMI.  Orders:  No orders of the defined  types were placed in this encounter.  No orders of the defined types were placed in this encounter.    Procedures: No procedures performed  Clinical Data: No additional findings.  ROS:  All other systems negative, except as noted in the HPI. Review of Systems  Objective: Vital Signs: There were no vitals taken for this visit.  Specialty Comments:  No specialty comments available.  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   Hypertension    Umbilical hernia     Family History  Problem Relation Age of Onset   Heart attack Mother    Heart attack Maternal Grandmother    Heart attack Maternal Grandfather    Stroke Paternal Grandmother    Stroke Paternal Grandfather    Heart attack Brother    Heart  attack Sister     Past Surgical History:  Procedure Laterality Date   AORTIC ROOT REPLACEMENT  04-07-2007   aortic dissection repair and AVR   COLONOSCOPY     FOOT SURGERY  09-03-2005   HERNIA REPAIR     left in 03/1984; right in 03/1977   TOTAL HIP ARTHROPLASTY Right 08/25/2014   Procedure: TOTAL HIP ARTHROPLASTY;  Surgeon: Nadara Mustard, MD;  Location: [facility];  Service: Orthopedics;  Laterality: Right;  Right Total Hip Arthroplasty   Social History   Occupational History   Not on file  Tobacco Use   Smoking status: Never   Smokeless tobacco: Never  Substance and Sexual Activity   Alcohol use: Yes    Comment: wine occasional   Drug use: No   Sexual activity: Not on file

## 2023-04-19 DIAGNOSIS — J309 Allergic rhinitis, unspecified: Secondary | ICD-10-CM | POA: Diagnosis not present

## 2023-04-19 DIAGNOSIS — R7303 Prediabetes: Secondary | ICD-10-CM | POA: Diagnosis not present

## 2023-04-19 DIAGNOSIS — E782 Mixed hyperlipidemia: Secondary | ICD-10-CM | POA: Diagnosis not present

## 2023-04-19 DIAGNOSIS — Z0000 Encounter for general adult medical examination without abnormal findings: Secondary | ICD-10-CM | POA: Diagnosis not present

## 2023-04-19 DIAGNOSIS — I10 Essential (primary) hypertension: Secondary | ICD-10-CM | POA: Diagnosis not present

## 2023-04-19 DIAGNOSIS — Z6836 Body mass index (BMI) 36.0-36.9, adult: Secondary | ICD-10-CM | POA: Diagnosis not present

## 2023-04-19 DIAGNOSIS — R972 Elevated prostate specific antigen [PSA]: Secondary | ICD-10-CM | POA: Diagnosis not present

## 2023-04-19 DIAGNOSIS — N529 Male erectile dysfunction, unspecified: Secondary | ICD-10-CM | POA: Diagnosis not present

## 2023-04-19 DIAGNOSIS — Z8679 Personal history of other diseases of the circulatory system: Secondary | ICD-10-CM | POA: Diagnosis not present

## 2023-04-19 DIAGNOSIS — I872 Venous insufficiency (chronic) (peripheral): Secondary | ICD-10-CM | POA: Diagnosis not present

## 2023-04-19 DIAGNOSIS — K429 Umbilical hernia without obstruction or gangrene: Secondary | ICD-10-CM | POA: Diagnosis not present

## 2023-05-16 DIAGNOSIS — K429 Umbilical hernia without obstruction or gangrene: Secondary | ICD-10-CM | POA: Diagnosis not present

## 2023-05-23 ENCOUNTER — Ambulatory Visit: Payer: Federal, State, Local not specified - PPO | Admitting: Orthopedic Surgery

## 2023-05-23 DIAGNOSIS — R972 Elevated prostate specific antigen [PSA]: Secondary | ICD-10-CM | POA: Diagnosis not present

## 2023-05-29 DIAGNOSIS — N401 Enlarged prostate with lower urinary tract symptoms: Secondary | ICD-10-CM | POA: Diagnosis not present

## 2023-05-29 DIAGNOSIS — R972 Elevated prostate specific antigen [PSA]: Secondary | ICD-10-CM | POA: Diagnosis not present

## 2023-05-29 DIAGNOSIS — N5201 Erectile dysfunction due to arterial insufficiency: Secondary | ICD-10-CM | POA: Diagnosis not present

## 2023-05-29 DIAGNOSIS — R3912 Poor urinary stream: Secondary | ICD-10-CM | POA: Diagnosis not present

## 2023-05-30 ENCOUNTER — Ambulatory Visit (INDEPENDENT_AMBULATORY_CARE_PROVIDER_SITE_OTHER): Payer: Medicare Other | Admitting: Orthopedic Surgery

## 2023-05-30 DIAGNOSIS — B351 Tinea unguium: Secondary | ICD-10-CM

## 2023-05-30 DIAGNOSIS — I872 Venous insufficiency (chronic) (peripheral): Secondary | ICD-10-CM | POA: Diagnosis not present

## 2023-06-14 ENCOUNTER — Encounter: Payer: Self-pay | Admitting: Orthopedic Surgery

## 2023-06-14 NOTE — Progress Notes (Signed)
Office Visit Note   Patient: Michael Moore           Date of Birth: 02-17-1951           MRN: [id number] Visit Date: 05/30/2023              Requested by: Georgann Housekeeper, MD [address] [place],  [place] [address] PCP: Georgann Housekeeper, MD  Chief Complaint  Patient presents with   Right Foot - Follow-up    Bilateral toenail trimming   Left Foot - Follow-up      HPI: Patient is a 72 year old gentleman who is seen in follow-up for both lower extremities.  Patient has a history of onychomycotic nails and venous insufficiency.  Assessment & Plan: Visit Diagnoses:  1. Onychomycosis   2. Venous stasis dermatitis of both lower extremities     Plan: Nails were trimmed.  No open venous ulcers.  Follow-Up Instructions: Return in about 3 months (around 08/30/2023).   Ortho Exam  Patient is alert, oriented, no adenopathy, well-dressed, normal affect, normal respiratory effort. Examination of both legs there is venous swelling but no open ulcers.  There is thickened discolored onychomycotic nails.  Patient is unable to safely trim the nails on his own and the nails were trimmed x 10 without complications.  Imaging: No results found. No images are attached to the encounter.  Labs: No results found for: "HGBA1C", "ESRSEDRATE", "CRP", "LABURIC", "REPTSTATUS", "GRAMSTAIN", "CULT", "LABORGA"   Lab Results  Component Value Date   ALBUMIN 3.7 08/11/2014    No results found for: "MG" No results found for: "VD25OH"  No results found for: "PREALBUMIN"    Latest Ref Rng & Units 07/23/2020   10:10 PM 08/27/2014    6:10 AM 08/26/2014    5:15 AM  CBC EXTENDED  WBC 4.0 - 10.5 K/uL 7.3  7.7  8.7   RBC 4.22 - 5.81 MIL/uL 4.63  3.90  3.86   Hemoglobin 13.0 - 17.0 g/dL 40.3  47.4  25.9   HCT 39.0 - 52.0 % 43.1  35.7  34.9   Platelets 150 - 400 K/uL 183  178  179      There is no height or weight on file to calculate BMI.  Orders:  No orders of the defined types  were placed in this encounter.  No orders of the defined types were placed in this encounter.    Procedures: No procedures performed  Clinical Data: No additional findings.  ROS:  All other systems negative, except as noted in the HPI. Review of Systems  Objective: Vital Signs: There were no vitals taken for this visit.  Specialty Comments:  No specialty comments available.  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   Hypertension    Umbilical hernia     Family History  Problem Relation Age of Onset   Heart attack Mother    Heart attack Maternal Grandmother    Heart attack Maternal Grandfather    Stroke Paternal Grandmother    Stroke Paternal Grandfather    Heart attack Brother  Heart attack Sister     Past Surgical History:  Procedure Laterality Date   AORTIC ROOT REPLACEMENT  04-07-2007   aortic dissection repair and AVR   COLONOSCOPY     FOOT SURGERY  09-03-2005   HERNIA REPAIR     left in 03/1984; right in 03/1977   TOTAL HIP ARTHROPLASTY Right 08/25/2014   Procedure: TOTAL HIP ARTHROPLASTY;  Surgeon: Nadara Mustard, MD;  Location: [facility];  Service: Orthopedics;  Laterality: Right;  Right Total Hip Arthroplasty   Social History   Occupational History   Not on file  Tobacco Use   Smoking status: Never   Smokeless tobacco: Never  Substance and Sexual Activity   Alcohol use: Yes    Comment: wine occasional   Drug use: No   Sexual activity: Not on file

## 2023-07-12 NOTE — Pre-Procedure Instructions (Signed)
Surgical Instructions    Your procedure is scheduled on July 23, 2023.  Report to [facility] Main Entrance "A" at 5:30 A.M., then check in with the Admitting office.  Call this number if you have problems the morning of surgery:  [phone]   If you have any questions prior to your surgery date call [phone]: Open Monday-Friday 8am-4pm If you experience any cold or flu symptoms such as cough, fever, chills, shortness of breath, etc. between now and your scheduled surgery, please notify Michael Moore at the above number     Remember:  Do not eat after midnight the night before your surgery  You may drink clear liquids until 4:30AM the morning of your surgery.   Clear liquids allowed are: Water, Non-Citrus Juices (without pulp), Carbonated Beverages, Clear Tea, Black Coffee ONLY (NO MILK, CREAM OR POWDERED CREAMER of any kind), and Gatorade    Take these medicines the morning of surgery with A SIP OF WATER:  metoprolol succinate (TOPROL-XL)   Follow your surgeon's instructions on when to stop Aspirin.  If no instructions were given by your surgeon then you will need to call the office to get those instructions.    As of today, STOP taking any (unless otherwise instructed by your surgeon) Aleve, Naproxen, Ibuprofen, Motrin, Advil, Goody's, BC's, all herbal medications, fish oil, and all vitamins.        [facility] is not responsible for any belongings or valuables.    Do NOT Smoke (Tobacco/Vaping)  24 hours prior to your procedure  If you use a CPAP at night, you may bring your mask for your overnight stay.   Contacts, glasses, hearing aids, dentures or partials may not be worn into surgery, please bring cases for these belongings   For patients admitted to the hospital, discharge time will be determined by your treatment team.   Patients discharged the day of surgery will not be allowed to drive home, and someone needs to stay with them for 24 hours.   SURGICAL WAITING ROOM  VISITATION Patients having surgery or a procedure may have no more than 2 support people in the waiting area - these visitors may rotate.   Children under the age of 86 must have an adult with them who is not the patient. If the patient needs to stay at the hospital during part of their recovery, the visitor guidelines for inpatient rooms apply. Pre-op nurse will coordinate an appropriate time for 1 support person to accompany patient in pre-op.  This support person may not rotate.   Please refer to [email or web address] for the visitor guidelines for Inpatients (after your surgery is over and you are in a regular room).    Special instructions:    Oral Hygiene is also important to reduce your risk of infection.  Remember - BRUSH YOUR TEETH THE MORNING OF SURGERY WITH YOUR REGULAR TOOTHPASTE   [place]- Preparing For Surgery  Before surgery, you can play an important role. Because skin is not sterile, your skin needs to be as free of germs as possible. You can reduce the number of germs on your skin by washing with CHG (chlorahexidine gluconate) Soap before surgery.  CHG is an antiseptic cleaner which kills germs and bonds with the skin to continue killing germs even after washing.     Please do not use if you have an allergy to CHG or antibacterial soaps. If your skin becomes reddened/irritated stop using the CHG.  Do not shave (including legs and underarms) for  at least 48 hours prior to first CHG shower. It is OK to shave your face.  Please follow these instructions carefully.     Shower the NIGHT BEFORE SURGERY and the MORNING OF SURGERY with CHG Soap.   If you chose to wash your hair, wash your hair first as usual with your normal shampoo. After you shampoo, rinse your hair and body thoroughly to remove the shampoo.  Then [employer] and genitals (private parts) with your normal soap and rinse thoroughly to remove soap.  After that Use  CHG Soap as you would any other liquid soap. You can apply CHG directly to the skin and wash gently with a scrungie or a clean washcloth.   Apply the CHG Soap to your body ONLY FROM THE NECK DOWN.  Do not use on open wounds or open sores. Avoid contact with your eyes, ears, mouth and genitals (private parts). Wash Face and genitals (private parts)  with your normal soap.   Wash thoroughly, paying special attention to the area where your surgery will be performed.  Thoroughly rinse your body with warm water from the neck down.  DO NOT shower/wash with your normal soap after using and rinsing off the CHG Soap.  Pat yourself dry with a CLEAN TOWEL.  Wear CLEAN PAJAMAS to bed the night before surgery  Place CLEAN SHEETS on your bed the night before your surgery  DO NOT SLEEP WITH PETS.   Day of Surgery:  Take a shower with CHG soap. Wear Clean/Comfortable clothing the morning of surgery Do not wear jewelry or makeup. Do not wear lotions, powders, perfumes/cologne or deodorant. Do not shave 48 hours prior to surgery.  Men may shave face and neck. Do not bring valuables to the hospital. Do not wear nail polish, gel polish, artificial nails, or any other type of covering on natural nails (fingers and toes) If you have artificial nails or gel coating that need to be removed by a nail salon, please have this removed prior to surgery. Artificial nails or gel coating may interfere with anesthesia's ability to adequately monitor your vital signs. Remember to brush your teeth WITH YOUR REGULAR TOOTHPASTE.    If you received a COVID test during your pre-op visit, it is requested that you wear a mask when out in public, stay away from anyone that may not be feeling well, and notify your surgeon if you develop symptoms. If you have been in contact with anyone that has tested positive in the last 10 days, please notify your surgeon.    Please read over the following fact sheets that you were  given.

## 2023-07-15 ENCOUNTER — Other Ambulatory Visit: Payer: Self-pay

## 2023-07-15 ENCOUNTER — Encounter (HOSPITAL_COMMUNITY)
Admission: RE | Admit: 2023-07-15 | Discharge: 2023-07-15 | Disposition: A | Discharge status: Home / Self Care | Payer: Medicare Other | Source: Ambulatory Visit | Attending: General Surgery | Admitting: General Surgery

## 2023-07-15 ENCOUNTER — Encounter (HOSPITAL_COMMUNITY): Payer: Self-pay

## 2023-07-15 VITALS — BP 132/73 | HR 54 | Temp 98.5°F | Resp 18 | Ht 72.0 in | Wt 253.8 lb

## 2023-07-15 DIAGNOSIS — Z01818 Encounter for other preprocedural examination: Secondary | ICD-10-CM | POA: Diagnosis not present

## 2023-07-15 DIAGNOSIS — I2510 Atherosclerotic heart disease of native coronary artery without angina pectoris: Secondary | ICD-10-CM | POA: Insufficient documentation

## 2023-07-15 DIAGNOSIS — I10 Essential (primary) hypertension: Secondary | ICD-10-CM | POA: Diagnosis not present

## 2023-07-15 HISTORY — DX: Personal history of Methicillin resistant Staphylococcus aureus infection: Z86.14

## 2023-07-15 HISTORY — DX: Personal history of other diseases of the digestive system: Z87.19

## 2023-07-15 LAB — BASIC METABOLIC PANEL
Anion gap: 15 (ref 5–15)
BUN: 23 mg/dL (ref 8–23)
CO2: 27 mmol/L (ref 22–32)
Calcium: 9.6 mg/dL (ref 8.9–10.3)
Chloride: 98 mmol/L (ref 98–111)
Creatinine, Ser: 1.26 mg/dL — ABNORMAL HIGH (ref 0.61–1.24)
GFR, Estimated: >60 mL/min (ref >60)
Glucose, Bld: 106 mg/dL — ABNORMAL HIGH (ref 70–99)
Potassium: 4 mmol/L (ref 3.5–5.1)
Sodium: 140 mmol/L (ref 135–145)

## 2023-07-15 LAB — CBC
HCT: 50.9 % (ref 39.0–52.0)
Hemoglobin: 16.7 g/dL (ref 13.0–17.0)
MCH: 30.4 pg (ref 26.0–34.0)
MCHC: 32.8 g/dL (ref 30.0–36.0)
MCV: 92.7 fL (ref 80.0–100.0)
Platelets: 187 10*3/uL (ref 150–400)
RBC: 5.49 MIL/uL (ref 4.22–5.81)
RDW: 12.2 % (ref 11.5–15.5)
WBC: 5.1 10*3/uL (ref 4.0–10.5)
nRBC: 0 % (ref 0.0–0.2)

## 2023-07-15 LAB — NO BLOOD PRODUCTS

## 2023-07-15 LAB — SURGICAL PCR SCREEN
MRSA, PCR: NEGATIVE
Staphylococcus aureus: NEGATIVE

## 2023-07-15 NOTE — Progress Notes (Signed)
PCP - Tyson Dense Cardiologist - Christa See, MR  PPM/ICD - denies   Chest x-ray - N/A EKG - 07/15/2023  Stress Test - pt reports he had a normal stress test at some point, but unsure when ECHO - 10/02/22- pt with history of AVR Cardiac Cath - denies  Sleep Study - denies   Fasting Blood Sugar - N/A   Last dose of GLP1 agonist-  N/A   Blood Thinner Instructions: N/A Aspirin Instructions: Follow your surgeon's instructions on when to stop Aspirin.  If no instructions were given by your surgeon then you will need to call the office to get those instructions.   I left message with surgery scheduler to reach out to patient regarding aspirin instructions, and pt refusal of blood products other than albumin, as well as for Dr. Derrell Lolling to place pre-op orders for patient. Office to reach out to patient regarding aspirin instructions.   ERAS Protcol -  ERAS per protocol- no orders placed by MD   COVID TEST-  N/A   Anesthesia review: yes- history of AVR- cardiologist notes in care everywhere. Pt states he was not asked to get medical or cardiac clearance for surgery. Pt has history of palpitations for years. Pt states he has no symptoms associated with palpitations. Pt denies any recent cardiac symptoms.   Patient denies shortness of breath, fever, cough and chest pain at PAT appointment   All instructions explained to the patient, with a verbal understanding of the material. Patient agrees to go over the instructions while at home for a better understanding. The opportunity to ask questions was provided.

## 2023-07-16 ENCOUNTER — Encounter (HOSPITAL_COMMUNITY): Payer: Self-pay | Admitting: Physician Assistant

## 2023-07-16 ENCOUNTER — Encounter (HOSPITAL_COMMUNITY): Payer: Self-pay

## 2023-07-16 NOTE — Anesthesia Preprocedure Evaluation (Addendum)
Anesthesia Evaluation  Patient identified by MRN, date of birth, ID band Patient awake    Reviewed: Allergy & Precautions, NPO status , Patient's Chart, lab work & pertinent test results, reviewed documented beta blocker date and time   Airway Mallampati: IV  TM Distance: >3 FB Neck ROM: Full    Dental  (+) Teeth Intact, Dental Advisory Given   Pulmonary  Snores at night, has never had sleep study, denies any witnessed apneas   Pulmonary exam normal breath sounds clear to auscultation       Cardiovascular hypertension (164/74, normally 130s SBP), Pt. on medications and Pt. 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  TTE 02/2021 showed EF 55 to 60%, bioprosthetic aortic valve with mean gradient 9.5 mmHg, no significant valvular stenosis or regurgitation.  Stable CT 11/2022.     Neuro/Psych negative neurological ROS  negative psych ROS   GI/Hepatic negative GI ROS, Neg liver ROS,,,  Endo/Other  negative endocrine ROS    Renal/GU Renal InsufficiencyRenal diseaseCr 1.26  negative genitourinary   Musculoskeletal  (+) Arthritis , Osteoarthritis,    Abdominal  (+) + obese  Peds  Hematology negative hematology ROS (+) Hb 16.7   Anesthesia Other Findings   Reproductive/Obstetrics negative OB ROS                             Anesthesia Physical Anesthesia Plan  ASA: 3  Anesthesia Plan: General   Post-op Pain Management: Tylenol PO (pre-op)*   Induction: Intravenous  PONV Risk Score and Plan: 3 and Ondansetron, Dexamethasone and Treatment may vary due to age or medical condition  Airway Management Planned: Oral ETT and Video Laryngoscope Planned  Additional Equipment:  None  Intra-op Plan:   Post-operative Plan: Extubation in OR  Informed Consent: I have reviewed the patients History and Physical, chart, labs and discussed the procedure including the risks, benefits and alternatives for the proposed anesthesia with the patient or authorized representative who has indicated his/her understanding and acceptance.     Dental advisory given  Plan Discussed with: CRNA  Anesthesia Plan Comments:         Anesthesia Quick Evaluation

## 2023-07-16 NOTE — Progress Notes (Signed)
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  TTE 02/2021 showed EF 55 to 60%, bioprosthetic aortic valve with mean gradient 9.5 mmHg, no significant valvular stenosis or regurgitation.  Stable CT 11/2022.  Last seen 10/02/2022 and noted to be doing well at that time, no trouble climbing hills or stairs.  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  Metoprolol was increased to 100 mg daily for better blood pressure control, otherwise no changes to management, recommended followup in 12 months and repeat echo in 1 to 2 years.  Preop labs reviewed, creatinine mildly elevated 1.26, otherwise unremarkable.  EKG 07/15/23: Sinus bradycardia. Rate 52.  CT chest/abdomen 12/26/22 (care everywhere): CONCLUSION:  1. 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  2.  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  3.  Small ventral abdominal wall hernia containing omentum and transverse colon.   TTE 10/02/22 (care everywhere): SUMMARY  The left ventricular size is normal.  Mild left ventricular hypertrophy  Left ventricular systolic function is normal.  LV ejection fraction = 55%.  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 Mean gradient =10.86mmHg.  There is no pericardial effusion.  There is no significant valvular stenosis or regurgitation.  Compared to the last study dated, 03/24/2021, probably no significant change,  including in AV gradient or LV function.     Zannie Cove [facility] Short Stay Center/Anesthesiology Phone [phone] 07/16/2023 1:04 PM

## 2023-07-23 ENCOUNTER — Other Ambulatory Visit: Payer: Self-pay

## 2023-07-23 ENCOUNTER — Encounter (HOSPITAL_COMMUNITY)
Admission: RE | Disposition: A | Discharge status: Home / Self Care | Payer: Self-pay | Source: Home / Self Care | Attending: General Surgery

## 2023-07-23 ENCOUNTER — Ambulatory Visit (HOSPITAL_COMMUNITY)
Admission: RE | Admit: 2023-07-23 | Discharge: 2023-07-23 | Disposition: A | Discharge status: Home / Self Care | Payer: Medicare Other | Attending: General Surgery | Admitting: General Surgery

## 2023-07-23 ENCOUNTER — Encounter (HOSPITAL_COMMUNITY): Payer: Self-pay | Admitting: General Surgery

## 2023-07-23 DIAGNOSIS — K439 Ventral hernia without obstruction or gangrene: Secondary | ICD-10-CM | POA: Diagnosis not present

## 2023-07-23 DIAGNOSIS — Z538 Procedure and treatment not carried out for other reasons: Secondary | ICD-10-CM | POA: Diagnosis not present

## 2023-07-23 SURGERY — REPAIR, HERNIA, VENTRAL, LAPAROSCOPY-ASSISTED
Anesthesia: General

## 2023-07-23 MED ORDER — ONDANSETRON HCL 4 MG/2ML IJ SOLN
INTRAMUSCULAR | Status: AC
  Filled 2023-07-23: qty 2

## 2023-07-23 MED ORDER — PHENYLEPHRINE 80 MCG/ML (10ML) SYRINGE FOR IV PUSH (FOR BLOOD PRESSURE SUPPORT)
PREFILLED_SYRINGE | INTRAVENOUS | Status: AC
  Filled 2023-07-23: qty 10

## 2023-07-23 MED ORDER — MIDAZOLAM HCL 2 MG/2ML IJ SOLN
INTRAMUSCULAR | Status: AC
  Filled 2023-07-23: qty 2

## 2023-07-23 MED ORDER — KETOROLAC TROMETHAMINE 30 MG/ML IJ SOLN
INTRAMUSCULAR | Status: AC
  Filled 2023-07-23: qty 1

## 2023-07-23 MED ORDER — BUPIVACAINE HCL (PF) 0.25 % IJ SOLN
INTRAMUSCULAR | Status: AC
  Filled 2023-07-23: qty 30

## 2023-07-23 MED ORDER — ROCURONIUM BROMIDE 10 MG/ML (PF) SYRINGE
PREFILLED_SYRINGE | INTRAVENOUS | Status: AC
  Filled 2023-07-23: qty 10

## 2023-07-23 MED ORDER — FENTANYL CITRATE (PF) 250 MCG/5ML IJ SOLN
INTRAMUSCULAR | Status: AC
  Filled 2023-07-23: qty 5

## 2023-07-23 MED ORDER — CHLORHEXIDINE GLUCONATE 0.12 % MT SOLN
15.0000 mL | Freq: Once | OROMUCOSAL | Status: AC
  Administered 2023-07-23: 15 mL via OROMUCOSAL
  Filled 2023-07-23: qty 15

## 2023-07-23 MED ORDER — PROPOFOL 10 MG/ML IV BOLUS
INTRAVENOUS | Status: AC
  Filled 2023-07-23: qty 20

## 2023-07-23 MED ORDER — SUCCINYLCHOLINE CHLORIDE 200 MG/10ML IV SOSY
PREFILLED_SYRINGE | INTRAVENOUS | Status: AC
  Filled 2023-07-23: qty 10

## 2023-07-23 MED ORDER — ACETAMINOPHEN 500 MG PO TABS
1000.0000 mg | ORAL_TABLET | Freq: Once | ORAL | Status: AC
  Administered 2023-07-23: 1000 mg via ORAL
  Filled 2023-07-23: qty 2

## 2023-07-23 MED ORDER — LIDOCAINE 2% (20 MG/ML) 5 ML SYRINGE
INTRAMUSCULAR | Status: AC
  Filled 2023-07-23: qty 5

## 2023-07-23 MED ORDER — ORAL CARE MOUTH RINSE: 15.0000 mL | Freq: Once | OROMUCOSAL | Status: AC

## 2023-07-23 MED ORDER — DEXAMETHASONE SODIUM PHOSPHATE 10 MG/ML IJ SOLN
INTRAMUSCULAR | Status: AC
  Filled 2023-07-23: qty 1

## 2023-07-23 MED ORDER — LACTATED RINGERS IV SOLN: INTRAVENOUS | Status: DC

## 2023-07-23 NOTE — Progress Notes (Signed)
Patient's surgery cancelled for today. Surgeon unavailable. PIV that was started by Karie Mainland B. CRNA discontinued. Site WNL and 4x4 applied and secured. Patient is aware to expect a call from the office to reschedule and patient is aware to contact the office if he has not been contacted in 2 days. Patient ambulatory to lobby with his spouse.

## 2023-08-23 DIAGNOSIS — H31012 Macula scars of posterior pole (postinflammatory) (post-traumatic), left eye: Secondary | ICD-10-CM | POA: Diagnosis not present

## 2023-08-23 DIAGNOSIS — H2513 Age-related nuclear cataract, bilateral: Secondary | ICD-10-CM | POA: Diagnosis not present

## 2023-09-02 ENCOUNTER — Ambulatory Visit (INDEPENDENT_AMBULATORY_CARE_PROVIDER_SITE_OTHER): Payer: Medicare Other | Admitting: Orthopedic Surgery

## 2023-09-02 ENCOUNTER — Encounter: Payer: Self-pay | Admitting: Orthopedic Surgery

## 2023-09-02 DIAGNOSIS — B351 Tinea unguium: Secondary | ICD-10-CM

## 2023-09-02 NOTE — Progress Notes (Signed)
Office Visit Note   Patient: Michael Moore           Date of Birth: 01/03/1951           MRN: [id number] Visit Date: 09/02/2023              Requested by: Georgann Housekeeper, MD [address] [place],  [place] [address] PCP: Georgann Housekeeper, MD  Chief Complaint  Patient presents with   Right Foot - Follow-up   Left Foot - Follow-up      HPI: Patient is a 72 year old gentleman who presents in follow-up for both feet.  Patient also has a lipoma on the posterior aspect of his neck that is persistent.  Assessment & Plan: Visit Diagnoses:  1. Onychomycosis     Plan: Continue to follow the lipoma expectantly.  Callus was pared on the left foot x 1 nails trimmed x 10.  Follow-Up Instructions: Return in about 3 months (around 12/03/2023).   Ortho Exam  Patient is alert, oriented, no adenopathy, well-dressed, normal affect, normal respiratory effort. Examination patient has a small lipoma on the posterior aspect of his neck approximately 3 cm in diameter.  There is no skin color changes.  Patient does have a hyperkeratotic lesion beneath the fourth metatarsal head of the left foot.  This was pared without complications.  Patient has thickened discolored onychomycotic nails x 10 he is unable to safely trim the nails on his own and the nails were trimmed x 10 without complication.  Imaging: No results found. No images are attached to the encounter.  Labs: No results found for: "HGBA1C", "ESRSEDRATE", "CRP", "LABURIC", "REPTSTATUS", "GRAMSTAIN", "CULT", "LABORGA"   Lab Results  Component Value Date   ALBUMIN 3.7 08/11/2014    No results found for: "MG" No results found for: "VD25OH"  No results found for: "PREALBUMIN"    Latest Ref Rng & Units 07/15/2023    2:41 PM 07/23/2020   10:10 PM 08/27/2014    6:10 AM  CBC EXTENDED  WBC 4.0 - 10.5 K/uL 5.1  7.3  7.7   RBC 4.22 - 5.81 MIL/uL 5.49  4.63  3.90   Hemoglobin 13.0 - 17.0 g/dL 95.2  84.1  32.4   HCT 39.0 -  52.0 % 50.9  43.1  35.7   Platelets 150 - 400 K/uL 187  183  178      There is no height or weight on file to calculate BMI.  Orders:  No orders of the defined types were placed in this encounter.  No orders of the defined types were placed in this encounter.    Procedures: No procedures performed  Clinical Data: No additional findings.  ROS:  All other systems negative, except as noted in the HPI. Review of Systems  Objective: Vital Signs: There were no vitals taken for this visit.  Specialty Comments:  No specialty comments available.  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   Essential hypertension 04/13/2014   History of aortic valve replacement 04/13/2014   Status post left foot surgery 04/13/2014   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  Hypertension    Umbilical hernia     Family History  Problem Relation Age of Onset   Heart attack Mother    Heart attack Maternal Grandmother    Heart attack Maternal Grandfather    Stroke Paternal Grandmother    Stroke Paternal Grandfather    Heart attack Brother    Heart attack Sister     Past Surgical History:  Procedure Laterality Date   AORTIC ROOT REPLACEMENT  04/07/2007   aortic dissection repair and AVR   COLONOSCOPY     FOOT SURGERY Left 09/03/2005   HERNIA REPAIR     left in 03/1984; right in 03/1977   TOTAL HIP ARTHROPLASTY Right 08/25/2014   Procedure: TOTAL HIP ARTHROPLASTY;  Surgeon: Nadara Mustard, MD;  Location: [facility] OR;  Service: Orthopedics;  Laterality: Right;  Right Total Hip Arthroplasty   Social History   Occupational History   Not on file  Tobacco Use   Smoking status: Never    Smokeless tobacco: Never  Vaping Use   Vaping status: Never Used  Substance and Sexual Activity   Alcohol use: Yes    Alcohol/week: 3.0 standard drinks of alcohol    Types: 3 Glasses of wine per week   Drug use: No   Sexual activity: Not on file

## 2023-10-07 ENCOUNTER — Ambulatory Visit (INDEPENDENT_AMBULATORY_CARE_PROVIDER_SITE_OTHER): Payer: Medicare Other | Admitting: Orthopedic Surgery

## 2023-10-07 ENCOUNTER — Other Ambulatory Visit (INDEPENDENT_AMBULATORY_CARE_PROVIDER_SITE_OTHER): Payer: Self-pay

## 2023-10-07 DIAGNOSIS — I517 Cardiomegaly: Secondary | ICD-10-CM | POA: Diagnosis not present

## 2023-10-07 DIAGNOSIS — Z9889 Other specified postprocedural states: Secondary | ICD-10-CM | POA: Diagnosis not present

## 2023-10-07 DIAGNOSIS — M79651 Pain in right thigh: Secondary | ICD-10-CM

## 2023-10-07 DIAGNOSIS — Z952 Presence of prosthetic heart valve: Secondary | ICD-10-CM | POA: Diagnosis not present

## 2023-10-07 DIAGNOSIS — I119 Hypertensive heart disease without heart failure: Secondary | ICD-10-CM | POA: Diagnosis not present

## 2023-10-07 DIAGNOSIS — Z79899 Other long term (current) drug therapy: Secondary | ICD-10-CM | POA: Diagnosis not present

## 2023-10-07 DIAGNOSIS — Z953 Presence of xenogenic heart valve: Secondary | ICD-10-CM | POA: Diagnosis not present

## 2023-10-07 DIAGNOSIS — I10 Essential (primary) hypertension: Secondary | ICD-10-CM | POA: Diagnosis not present

## 2023-10-07 DIAGNOSIS — Z8679 Personal history of other diseases of the circulatory system: Secondary | ICD-10-CM | POA: Diagnosis not present

## 2023-10-08 ENCOUNTER — Encounter: Payer: Self-pay | Admitting: Orthopedic Surgery

## 2023-10-08 NOTE — Progress Notes (Signed)
Office Visit Note   Patient: Michael Moore           Date of Birth: [birth year]           MRN: [id number] Visit Date: 10/07/2023              Requested by: Georgann Housekeeper, MD [address] [place],  [place] [address] PCP: Georgann Housekeeper, MD  Chief Complaint  Patient presents with   Right Leg - Pain      HPI: Patient is a 72 year old gentleman who presents with right thigh pain as well as soft tissue swelling right thigh.  Patient states that the anterior lateral aspect of his thigh feels numb.  Assessment & Plan: Visit Diagnoses:  1. Right thigh pain     Plan: Discussed that his total hip arthroplasty appears stable.  He has some varicose veins in the right thigh that appear to be the source of the soft tissue swelling.  The numbness is consistent with a degenerative disc disease in the lumbar spine.  Follow-Up Instructions: Return if symptoms worsen or fail to improve.   Ortho Exam  Patient is alert, oriented, no adenopathy, well-dressed, normal affect, normal respiratory effort. Examination patient has a negative straight leg raise and no focal motor weakness.  Patient does have varicose veins with some swelling around the veins on the right thigh.  No signs of infection.  Patient does have numbness to touch over the anterior aspect of the right thigh.  Radiographs of the lumbar spine shows collapse at L5-S1 with bony spurs at L1-2.  Right total hip arthroplasty is stable.  Imaging: No results found. No images are attached to the encounter.  Labs: No results found for: "HGBA1C", "ESRSEDRATE", "CRP", "LABURIC", "REPTSTATUS", "GRAMSTAIN", "CULT", "LABORGA"   Lab Results  Component Value Date   ALBUMIN 3.7 08/11/2014    No results found for: "MG" No results found for: "VD25OH"  No results found for: "PREALBUMIN"    Latest Ref Rng & Units 07/15/2023    2:41 PM 07/23/2020   10:10 PM 08/27/2014    6:10 AM  CBC EXTENDED  WBC 4.0 - 10.5 K/uL 5.1  7.3   7.7   RBC 4.22 - 5.81 MIL/uL 5.49  4.63  3.90   Hemoglobin 13.0 - 17.0 g/dL 52.8  41.3  24.4   HCT 39.0 - 52.0 % 50.9  43.1  35.7   Platelets 150 - 400 K/uL 187  183  178      There is no height or weight on file to calculate BMI.  Orders:  Orders Placed This Encounter  Procedures   XR Lumbar Spine 2-3 Views   No orders of the defined types were placed in this encounter.    Procedures: No procedures performed  Clinical Data: No additional findings.  ROS:  All other systems negative, except as noted in the HPI. Review of Systems  Objective: Vital Signs: There were no vitals taken for this visit.  Specialty Comments:  No specialty comments available.  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   Essential hypertension 04/13/2014   History of aortic valve replacement 04/13/2014   Status post left foot surgery 04/13/2014   Past Medical History:  Diagnosis Date   Arthritis    right hip   Dysrhythmia    palpitations   History of inguinal hernia repair, bilateral    History of methicillin resistant staphylococcus aureus (  MRSA)    to foot   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   Hypertension    Umbilical hernia     Family History  Problem Relation Age of Onset   Heart attack Mother    Heart attack Maternal Grandmother    Heart attack Maternal Grandfather    Stroke Paternal Grandmother    Stroke Paternal Grandfather    Heart attack Brother    Heart attack Sister     Past Surgical History:  Procedure Laterality Date   AORTIC ROOT REPLACEMENT  04/07/2007   aortic dissection repair and AVR   COLONOSCOPY     FOOT SURGERY Left 09/03/2005   HERNIA REPAIR     left in 03/1984; right in 03/1977   TOTAL HIP ARTHROPLASTY Right 08/25/2014   Procedure: TOTAL HIP ARTHROPLASTY;  Surgeon: Nadara Mustard, MD;  Location: [facility] OR;  Service: Orthopedics;  Laterality: Right;  Right Total  Hip Arthroplasty   Social History   Occupational History   Not on file  Tobacco Use   Smoking status: Never   Smokeless tobacco: Never  Vaping Use   Vaping status: Never Used  Substance and Sexual Activity   Alcohol use: Yes    Alcohol/week: 3.0 standard drinks of alcohol    Types: 3 Glasses of wine per week   Drug use: No   Sexual activity: Not on file

## 2023-11-07 ENCOUNTER — Ambulatory Visit (INDEPENDENT_AMBULATORY_CARE_PROVIDER_SITE_OTHER): Payer: Medicare Other | Admitting: Orthopedic Surgery

## 2023-11-07 DIAGNOSIS — M67432 Ganglion, left wrist: Secondary | ICD-10-CM | POA: Diagnosis not present

## 2023-11-11 ENCOUNTER — Encounter: Payer: Self-pay | Admitting: Orthopedic Surgery

## 2023-11-11 NOTE — Progress Notes (Signed)
 Office Visit Note   Patient: Michael Moore           Date of Birth: 1950/11/05           MRN: [id number] Visit Date: 11/07/2023              Requested by: Ransom Other, MD [address] [place],  [place] [address] PCP: Ransom Other, MD  Chief Complaint  Patient presents with   Left Wrist - Pain      HPI: Patient is a 73 year old gentleman who presents with an acute ganglion cyst volar left wrist.  Patient denies any pain.  Patient states the cyst is soft and moves.  Assessment & Plan: Visit Diagnoses:  1. Ganglion cyst of volar aspect of right wrist     Plan: Discussed that he does have a ganglion cyst.  Discussed treatment options are observation versus surgical excision.  Patient states he would like to proceed with excision of the cyst.  Will have him follow-up with Dr. Bennett.  Follow-Up Instructions: Return if symptoms worsen or fail to improve.   Ortho Exam  Patient is alert, oriented, no adenopathy, well-dressed, normal affect, normal respiratory effort. Examination patient has good range of motion of the left wrist.  He has no tenderness to palpation over the scaphoid scapholunate or TFCC.  He does have a soft mobile ganglion cyst over the FCR tendon volar aspect of the left wrist.  This is approximately 1 cm diameter.  It is soft and mobile.  Imaging: No results found. No images are attached to the encounter.  Labs: No results found for: HGBA1C, ESRSEDRATE, CRP, LABURIC, REPTSTATUS, GRAMSTAIN, CULT, LABORGA   Lab Results  Component Value Date   ALBUMIN 3.7 08/11/2014    No results found for: MG No results found for: VD25OH  No results found for: PREALBUMIN    Latest Ref Rng & Units 07/15/2023    2:41 PM 07/23/2020   10:10 PM 08/27/2014    6:10 AM  CBC EXTENDED  WBC 4.0 - 10.5 K/uL 5.1  7.3  7.7   RBC 4.22 - 5.81 MIL/uL 5.49  4.63  3.90   Hemoglobin 13.0 - 17.0 g/dL 83.2  85.4  87.9   HCT 39.0 - 52.0 % 50.9   43.1  35.7   Platelets 150 - 400 K/uL 187  183  178      There is no height or weight on file to calculate BMI.  Orders:  No orders of the defined types were placed in this encounter.  No orders of the defined types were placed in this encounter.    Procedures: No procedures performed  Clinical Data: No additional findings.  ROS:  All other systems negative, except as noted in the HPI. Review of Systems  Objective: Vital Signs: There were no vitals taken for this visit.  Specialty Comments:  No specialty comments available.  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   Hypertension    Umbilical hernia     Family History  Problem Relation Age of Onset   Heart attack Mother    Heart attack Maternal Grandmother    Heart attack Maternal Grandfather    Stroke Paternal Grandmother    Stroke Paternal Grandfather    Heart attack Brother    Heart attack Sister     Past Surgical History:  Procedure Laterality Date   AORTIC ROOT REPLACEMENT  04/07/2007   aortic dissection repair and AVR   COLONOSCOPY     FOOT SURGERY Left 09/03/2005   HERNIA REPAIR     left in 03/1984; right in 03/1977   TOTAL HIP ARTHROPLASTY Right 08/25/2014   Procedure: TOTAL HIP ARTHROPLASTY;  Surgeon: Jerona Harden GAILS, MD;  Location: [facility] OR;  Service: Orthopedics;  Laterality: Right;  Right Total Hip Arthroplasty   Social History   Occupational History   Not on file  Tobacco Use   Smoking status: Never   Smokeless  tobacco: Never  Vaping Use   Vaping status: Never Used  Substance and Sexual Activity   Alcohol use: Yes    Alcohol/week: 3.0 standard drinks of alcohol    Types: 3 Glasses of wine per week   Drug use: No   Sexual activity: Not on file

## 2023-11-19 DIAGNOSIS — K429 Umbilical hernia without obstruction or gangrene: Secondary | ICD-10-CM | POA: Diagnosis not present

## 2023-11-21 ENCOUNTER — Encounter: Payer: BC Managed Care – PPO | Admitting: Orthopedic Surgery

## 2023-12-02 ENCOUNTER — Ambulatory Visit (INDEPENDENT_AMBULATORY_CARE_PROVIDER_SITE_OTHER): Payer: Medicare Other | Admitting: Orthopedic Surgery

## 2023-12-02 ENCOUNTER — Other Ambulatory Visit (INDEPENDENT_AMBULATORY_CARE_PROVIDER_SITE_OTHER): Payer: Medicare Other

## 2023-12-02 DIAGNOSIS — M67432 Ganglion, left wrist: Secondary | ICD-10-CM | POA: Diagnosis not present

## 2023-12-02 NOTE — Progress Notes (Signed)
Michael Moore - 73 y.o. male MRN [id number]  Date of birth: 09-02-1951  Office Visit Note: Visit Date: 12/02/2023 PCP: Georgann Housekeeper, MD Referred by: Georgann Housekeeper, MD  Subjective: No chief complaint on file.  HPI: Michael Moore is a pleasant 73 y.o. male who presents today for evaluation of a left wrist volar ganglion cyst that is been present now for approximately 1 month.  He denies any significant pain in this region, has maintained good range of motion of the wrist without significant restriction.  Does have some slight numbness in the volar palmar aspect of the hand, denies any numbness in the fingers.  Pertinent ROS were reviewed with the patient and found to be negative unless otherwise specified above in HPI.   Visit Reason: left wrist volar ganglion cyst Duration of symptoms: 3-4 weeks Hand dominance: right Occupation: airport  Diabetic: No Smoking: No Heart/Lung History: yes Blood Thinners: baby aspirin  Prior Testing/EMG: none Injections (Date): none Treatments: none Prior Surgery: none  Assessment & Plan: Visit Diagnoses:  1. Ganglion cyst of volar aspect of left wrist     Plan: Extensive discussion was had with the patient today regarding his left wrist volar ganglion cyst.  We discussed the underlying pathophysiology of this condition as well as potential treatment options.  We discussed potential observation versus surgical excision.  I did explain the proximity of the cyst to the neurovascular structures, particular the radial artery which would make it unsafe for aspiration in the clinical setting.  Given that his symptoms are quite mild at this juncture, understanding risks and benefits, patient would like to continue with observation for the time being.  We discussed the surgical approach and process for volar ganglion cyst excision at the wrist as well as the postoperative recovery.  Risks and benefits of the procedure were discussed, risks including  but not limited to infection, bleeding, scarring, stiffness, nerve injury, tendon injury, vascular injury, recurrence of symptoms and need for subsequent operation.  Patient expressed understanding.    He will return to me in approximate 2 months time for repeat discussion and potential surgical planning of left wrist volar ganglion cyst excision.       Follow-up: No follow-ups on file.   Meds & Orders: No orders of the defined types were placed in this encounter.   Orders Placed This Encounter  Procedures   XR Wrist Complete Left     Procedures: No procedures performed      Clinical History: No specialty comments available.  He reports that he has never smoked. He has never used smokeless tobacco. No results for input(s): "HGBA1C", "LABURIC" in the last 8760 hours.  Objective:   Vital Signs: There were no vitals taken for this visit.  Physical Exam  Gen: Well-appearing, in no acute distress; non-toxic CV: Regular Rate. Well-perfused. Warm.  Resp: Breathing unlabored on room air; no wheezing. Psych: Fluid speech in conversation; appropriate affect; normal thought process  Ortho Exam Left wrist: - Notable volar mass, soft, compressible, mobile, transilluminates, measures approximately 2 cm x 2.5 cm, multilobulated - Palpable radial and ulnar pulses at the wrist, normal Allen's testing with refill of both radial and ulnar circulations at the hand - Mildly positive Tinel's over the cyst, sensation is intact to the distal aspect of the thumb index and long finger to light touch, slight area of numbness over the palmar volar aspect of the hand, likely in the distribution of the palmar cutaneous branch  Imaging: XR  Wrist Complete Left Result Date: 12/02/2023 X-rays of the left wrist, multiple views were obtained today X-rays demonstrate stable appearance of the radiocarpal and midcarpal articulations without significant bony abnormalities.  Ulnar neutral variance.   Past  Medical/Family/Surgical/Social History: Medications & Allergies reviewed per EMR, new medications updated. 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   Essential hypertension 04/13/2014   History of aortic valve replacement 04/13/2014   Status post left foot surgery 04/13/2014   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   Hypertension    Umbilical hernia    Family History  Problem Relation Age of Onset   Heart attack Mother    Heart attack Maternal Grandmother    Heart attack Maternal Grandfather    Stroke Paternal Grandmother    Stroke Paternal Grandfather    Heart attack Brother    Heart attack Sister    Past Surgical History:  Procedure Laterality Date   AORTIC ROOT REPLACEMENT  04/07/2007   aortic dissection repair and AVR   COLONOSCOPY     FOOT SURGERY Left 09/03/2005   HERNIA REPAIR     left in 03/1984; right in 03/1977   TOTAL HIP ARTHROPLASTY Right 08/25/2014   Procedure: TOTAL HIP ARTHROPLASTY;  Surgeon: Nadara Mustard, MD;  Location: [facility] OR;  Service: Orthopedics;  Laterality: Right;  Right Total Hip Arthroplasty   Social History   Occupational History   Not on file  Tobacco Use   Smoking status: Never   Smokeless tobacco: Never  Vaping Use   Vaping status: Never Used  Substance and Sexual Activity   Alcohol use: Yes    Alcohol/week: 3.0 standard drinks of alcohol    Types: 3 Glasses of wine per week   Drug use: No   Sexual activity: Not on file    Shiraz Bastyr Fara Boros) Denese Killings, M.D. [facility] 10:18 AM

## 2023-12-03 ENCOUNTER — Encounter: Payer: BC Managed Care – PPO | Admitting: Orthopedic Surgery

## 2023-12-16 ENCOUNTER — Ambulatory Visit (INDEPENDENT_AMBULATORY_CARE_PROVIDER_SITE_OTHER): Payer: Medicare Other | Admitting: Orthopedic Surgery

## 2023-12-16 DIAGNOSIS — M67432 Ganglion, left wrist: Secondary | ICD-10-CM | POA: Diagnosis not present

## 2023-12-16 DIAGNOSIS — I872 Venous insufficiency (chronic) (peripheral): Secondary | ICD-10-CM | POA: Diagnosis not present

## 2023-12-16 DIAGNOSIS — B351 Tinea unguium: Secondary | ICD-10-CM

## 2023-12-17 ENCOUNTER — Encounter: Payer: Self-pay | Admitting: Orthopedic Surgery

## 2023-12-17 NOTE — Progress Notes (Signed)
 Office Visit Note   Patient: Michael Moore           Date of Birth: 1951-10-27           MRN: [id number] Visit Date: 12/16/2023              Requested by: Georgann Housekeeper, MD [address] [place],  [place] [address] PCP: Georgann Housekeeper, MD  Chief Complaint  Patient presents with   Right Foot - Follow-up    Bilateral nail trim    Left Foot - Follow-up      HPI: Patient is a 73 year old gentleman who presents in follow-up for ganglion cyst left wrist venous insufficiency both legs and onychomycotic nails x 10.  Assessment & Plan: Visit Diagnoses:  1. Ganglion cyst of volar aspect of left wrist   2. Onychomycosis   3. Venous stasis dermatitis of both lower extremities     Plan: Nails were trimmed x 10 without complications.  No treatment for the ganglion cyst at this time.  Follow-Up Instructions: Return in about 3 months (around 03/14/2024).   Ortho Exam  Patient is alert, oriented, no adenopathy, well-dressed, normal affect, normal respiratory effort. Examination patient has a stable ganglion cyst over the FCR tendon volar aspect left wrist.  There is no cellulitis no enlargement.  Patient has stable venous insufficiency without ulceration.  There are thickened discolored onychomycotic nails x 10 the patient is unable to trim the nails on his own and the nails were trimmed x 10 without complication.  Imaging: No results found. No images are attached to the encounter.  Labs: No results found for: "HGBA1C", "ESRSEDRATE", "CRP", "LABURIC", "REPTSTATUS", "GRAMSTAIN", "CULT", "LABORGA"   Lab Results  Component Value Date   ALBUMIN 3.7 08/11/2014    No results found for: "MG" No results found for: "VD25OH"  No results found for: "PREALBUMIN"    Latest Ref Rng & Units 07/15/2023    2:41 PM 07/23/2020   10:10 PM 08/27/2014    6:10 AM  CBC EXTENDED  WBC 4.0 - 10.5 K/uL 5.1  7.3  7.7   RBC 4.22 - 5.81 MIL/uL 5.49  4.63  3.90   Hemoglobin 13.0 - 17.0  g/dL 19.1  47.8  29.5   HCT 39.0 - 52.0 % 50.9  43.1  35.7   Platelets 150 - 400 K/uL 187  183  178      There is no height or weight on file to calculate BMI.  Orders:  No orders of the defined types were placed in this encounter.  No orders of the defined types were placed in this encounter.    Procedures: No procedures performed  Clinical Data: No additional findings.  ROS:  All other systems negative, except as noted in the HPI. Review of Systems  Objective: Vital Signs: There were no vitals taken for this visit.  Specialty Comments:  No specialty comments available.  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   Essential hypertension 04/13/2014   History of aortic valve replacement 04/13/2014   Status post left foot surgery 04/13/2014   Past Medical History:  Diagnosis Date   Arthritis    right hip   Dysrhythmia    palpitations   History of inguinal hernia repair, bilateral    History of methicillin resistant staphylococcus aureus (MRSA)    to foot   Hx of aortic root repair    dissection repair and  AVR in 2008   Hypertension    Umbilical hernia     Family History  Problem Relation Age of Onset   Heart attack Mother    Heart attack Maternal Grandmother    Heart attack Maternal Grandfather    Stroke Paternal Grandmother    Stroke Paternal Grandfather    Heart attack Brother    Heart attack Sister     Past Surgical History:  Procedure Laterality Date   AORTIC ROOT REPLACEMENT  04/07/2007   aortic dissection repair and AVR   COLONOSCOPY     FOOT SURGERY Left 09/03/2005   HERNIA REPAIR     left in 03/1984; right in 03/1977   TOTAL HIP ARTHROPLASTY Right 08/25/2014   Procedure: TOTAL HIP ARTHROPLASTY;  Surgeon: Nadara Mustard, MD;  Location: [facility] OR;  Service: Orthopedics;  Laterality: Right;  Right Total Hip Arthroplasty   Social History   Occupational History   Not on file  Tobacco  Use   Smoking status: Never   Smokeless tobacco: Never  Vaping Use   Vaping status: Never Used  Substance and Sexual Activity   Alcohol use: Yes    Alcohol/week: 3.0 standard drinks of alcohol    Types: 3 Glasses of wine per week   Drug use: No   Sexual activity: Not on file

## 2023-12-30 ENCOUNTER — Ambulatory Visit: Payer: Self-pay | Admitting: General Surgery

## 2023-12-30 NOTE — H&P (Signed)
 Chief Complaint: long term follow up (Long term follow up [facility])       History of Present Illness: Michael Moore is a 73 y.o. male who is seen today as an office consultation at the request of Dr. Seymour Bars for evaluation of long term follow up (Long term follow up [facility]) .   Patient is a 73 year old male who comes in for an evaluation of a ventral hernia.  Patient previously had this scheduled but had to be canceled. Patient has been doing well since his last clinic visit.  He would like to reschedule surgery.  He has had no change in the hernia.           Review of Systems: A complete review of systems was obtained from the patient.  I have reviewed this information and discussed as appropriate with the patient.  See HPI as well for other ROS.   Review of Systems  Constitutional:  Negative for fever.  HENT:  Negative for congestion.   Eyes:  Negative for blurred vision.  Respiratory:  Negative for cough, shortness of breath and wheezing.   Cardiovascular:  Negative for chest pain and palpitations.  Gastrointestinal:  Negative for heartburn.  Genitourinary:  Negative for dysuria.  Musculoskeletal:  Negative for myalgias.  Skin:  Negative for rash.  Neurological:  Negative for dizziness and headaches.  Psychiatric/Behavioral:  Negative for depression and suicidal ideas.   All other systems reviewed and are negative.       Medical History: Past Medical History Past Medical History: Diagnosis Date  Aneurysm (CMS-HCC)    Heart valve disease    Hypertension         Problem List There is no problem list on file for this patient.     Past Surgical History Past Surgical History: Procedure Laterality Date  REPLACEMENT AORTIC VALVE   2008  Foot surgery  Left    HERNIA REPAIR      JOINT REPLACEMENT       Hip      Allergies No Known Allergies    Medications Ordered Prior to Encounter Current Outpatient Medications on File Prior to  Visit Medication Sig Dispense Refill  aspirin 81 MG EC tablet 1 tablet Orally Once a day for 30 day(s)      hydroCHLOROthiazide (HYDRODIURIL) 25 MG tablet Take by mouth      losartan (COZAAR) 100 MG tablet Take 100 mg by mouth once daily      metoprolol succinate (TOPROL-XL) 100 MG XL tablet Take 100 mg by mouth once daily        No current facility-administered medications on file prior to visit.      Family History History reviewed. No pertinent family history.     Tobacco Use History Social History    Tobacco Use Smoking Status Never Smokeless Tobacco Never      Social History Social History    Socioeconomic History  Marital status: Married Tobacco Use  Smoking status: Never  Smokeless tobacco: Never Substance and Sexual Activity  Alcohol use: Never  Drug use: Never    Social Drivers of Health    Food Insecurity: Low Risk  (10/07/2023)   Received from [facility]   Hunger Vital Sign    Worried About Running Out of Food in the Last Year: Never true    Ran Out of Food in the Last Year: Never true Transportation Needs: No Transportation Needs (10/07/2023)   Received from [facility]    In the  past 12 months, has lack of reliable transportation kept you from medical appointments, meetings, work or from getting things needed for daily living? : No Housing Stability: Unknown (11/19/2023)   Housing Stability Vital Sign    Homeless in the Last Year: No      Objective:     Vitals:   11/19/23 1500 Pulse: 70 Temp: 36.8 C (98.2 F) SpO2: 95% Weight: (!) 120.6 kg (265 lb 12.8 oz) PainSc: 0-No pain   Body mass index is 36.05 kg/m.   Physical Exam Constitutional:      General: He is not in acute distress.    Appearance: Normal appearance.  HENT:     Head: Normocephalic.     Nose: No rhinorrhea.     Mouth/Throat:     Mouth: Mucous membranes are moist.     Pharynx: Oropharynx is clear.  Eyes:     General: No scleral icterus.     Pupils: Pupils are equal, round, and reactive to light.  Cardiovascular:     Rate and Rhythm: Normal rate.     Pulses: Normal pulses.  Pulmonary:     Effort: Pulmonary effort is normal. No respiratory distress.     Breath sounds: No stridor. No wheezing.  Abdominal:     General: Abdomen is flat. There is no distension.     Tenderness: There is no abdominal tenderness. There is no guarding or rebound.     Hernia: A hernia is present. Hernia is present in the umbilical area.  Musculoskeletal:        General: Normal range of motion.     Cervical back: Normal range of motion and neck supple.  Skin:    General: Skin is warm and dry.     Capillary Refill: Capillary refill takes less than 2 seconds.     Coloration: Skin is not jaundiced.  Neurological:     General: No focal deficit present.     Mental Status: He is alert and oriented to person, place, and time. Mental status is at baseline.  Psychiatric:        Mood and Affect: Mood normal.        Thought Content: Thought content normal.        Judgment: Judgment normal.        Hernia Size:4cm Incarcerated: Yes Initial Hernia     Assessment and Plan: Diagnoses and all orders for this visit:   Umbilical hernia without obstruction or gangrene     Michael Moore is a 73 y.o. male    1.  We will proceed to the OR for a lap ventral hernia repair with mesh. 2. All risks and benefits were discussed with the patient, to generally include infection, bleeding, damage to surrounding structures, acute and chronic nerve pain, and recurrence. Alternatives were offered and described.  All questions were answered and the patient voiced understanding of the procedure and wishes to proceed at this point.             No follow-ups on file.   Axel Filler, MD, [facility], [facility]

## 2023-12-30 NOTE — Pre-Procedure Instructions (Signed)
 Surgical Instructions   Your procedure is scheduled on Wednesday, March 12th. Report to [facility] Main Entrance "A" at 06:30 A.M., then check in with the Admitting office. Any questions or running late day of surgery: call [phone]  Questions prior to your surgery date: call [phone], Monday-Friday, 8am-4pm. If you experience any cold or flu symptoms such as cough, fever, chills, shortness of breath, etc. between now and your scheduled surgery, please notify us at the above number.     Remember:  Do not eat after midnight the night before your surgery   You may drink clear liquids until 05:30 AM the morning of your surgery.   Clear liquids allowed are: Water, Non-Citrus Juices (without pulp), Carbonated Beverages, Clear Tea (no milk, honey, etc.), Black Coffee Only (NO MILK, CREAM OR POWDERED CREAMER of any kind), and Gatorade.    Take these medicines the morning of surgery with A SIP OF WATER  metoprolol succinate (TOPROL-XL)    Follow your surgeon's instructions on when to stop Aspirin.  If no instructions were given by your surgeon then you will need to call the office to get those instructions.    One week prior to surgery, STOP taking any Aleve, Naproxen, Ibuprofen, Motrin, Advil, Goody's, BC's, all herbal medications, fish oil, and non-prescription vitamins.                     Do NOT Smoke (Tobacco/Vaping) for 24 hours prior to your procedure.  If you use a CPAP at night, you may bring your mask/headgear for your overnight stay.   You will be asked to remove any contacts, glasses, piercing's, hearing aid's, dentures/partials prior to surgery. Please bring cases for these items if needed.    Patients discharged the day of surgery will not be allowed to drive home, and someone needs to stay with them for 24 hours.  SURGICAL WAITING ROOM VISITATION Patients may have no more than 2 support people in the waiting area - these visitors may rotate.   Pre-op nurse will  coordinate an appropriate time for 1 ADULT support person, who may not rotate, to accompany patient in pre-op.  Children under the age of 9 must have an adult with them who is not the patient and must remain in the main waiting area with an adult.  If the patient needs to stay at the hospital during part of their recovery, the visitor guidelines for inpatient rooms apply.  Please refer to the [facility] website for the visitor guidelines for any additional information.   If you received a COVID test during your pre-op visit  it is requested that you wear a mask when out in public, stay away from anyone that may not be feeling well and notify your surgeon if you develop symptoms. If you have been in contact with anyone that has tested positive in the last 10 days please notify you surgeon.      Pre-operative CHG Bathing Instructions   You can play a key role in reducing the risk of infection after surgery. Your skin needs to be as free of germs as possible. You can reduce the number of germs on your skin by washing with CHG (chlorhexidine gluconate) soap before surgery. CHG is an antiseptic soap that kills germs and continues to kill germs even after washing.   DO NOT use if you have an allergy to chlorhexidine/CHG or antibacterial soaps. If your skin becomes reddened or irritated, stop using the CHG and notify one  of our RNs at [phone].              TAKE A SHOWER THE NIGHT BEFORE SURGERY AND THE DAY OF SURGERY    Please keep in mind the following:  DO NOT shave, including legs and underarms, 48 hours prior to surgery.   You may shave your face before/day of surgery.  Place clean sheets on your bed the night before surgery Use a clean washcloth (not used since being washed) for each shower. DO NOT sleep with pet's night before surgery.  CHG Shower Instructions:  Wash your face and private area with normal soap. If you choose to wash your hair, wash first with your normal shampoo.   After you use shampoo/soap, rinse your hair and body thoroughly to remove shampoo/soap residue.  Turn the water OFF and apply half the bottle of CHG soap to a CLEAN washcloth.  Apply CHG soap ONLY FROM YOUR NECK DOWN TO YOUR TOES (washing for 3-5 minutes)  DO NOT use CHG soap on face, private areas, open wounds, or sores.  Pay special attention to the area where your surgery is being performed.  If you are having back surgery, having someone wash your back for you may be helpful. Wait 2 minutes after CHG soap is applied, then you may rinse off the CHG soap.  Pat dry with a clean towel  Put on clean pajamas    Additional instructions for the day of surgery: DO NOT APPLY any lotions, deodorants, cologne, or perfumes.   Do not wear jewelry or makeup Do not wear nail polish, gel polish, artificial nails, or any other type of covering on natural nails (fingers and toes) Do not bring valuables to the hospital. [facility] is not responsible for valuables/personal belongings. Put on clean/comfortable clothes.  Please brush your teeth.  Ask your nurse before applying any prescription medications to the skin.

## 2023-12-30 NOTE — H&P (View-Only) (Signed)
 Chief Complaint: long term follow up (Long term follow up [facility])       History of Present Illness: Michael Moore is a 73 y.o. male who is seen today as an office consultation at the request of Dr. Seymour Bars for evaluation of long term follow up (Long term follow up [facility]) .   Patient is a 73 year old male who comes in for an evaluation of a ventral hernia.  Patient previously had this scheduled but had to be canceled. Patient has been doing well since his last clinic visit.  He would like to reschedule surgery.  He has had no change in the hernia.           Review of Systems: A complete review of systems was obtained from the patient.  I have reviewed this information and discussed as appropriate with the patient.  See HPI as well for other ROS.   Review of Systems  Constitutional:  Negative for fever.  HENT:  Negative for congestion.   Eyes:  Negative for blurred vision.  Respiratory:  Negative for cough, shortness of breath and wheezing.   Cardiovascular:  Negative for chest pain and palpitations.  Gastrointestinal:  Negative for heartburn.  Genitourinary:  Negative for dysuria.  Musculoskeletal:  Negative for myalgias.  Skin:  Negative for rash.  Neurological:  Negative for dizziness and headaches.  Psychiatric/Behavioral:  Negative for depression and suicidal ideas.   All other systems reviewed and are negative.       Medical History: Past Medical History Past Medical History: Diagnosis Date  Aneurysm (CMS-HCC)    Heart valve disease    Hypertension         Problem List There is no problem list on file for this patient.     Past Surgical History Past Surgical History: Procedure Laterality Date  REPLACEMENT AORTIC VALVE   2008  Foot surgery  Left    HERNIA REPAIR      JOINT REPLACEMENT       Hip      Allergies No Known Allergies    Medications Ordered Prior to Encounter Current Outpatient Medications on File Prior to  Visit Medication Sig Dispense Refill  aspirin 81 MG EC tablet 1 tablet Orally Once a day for 30 day(s)      hydroCHLOROthiazide (HYDRODIURIL) 25 MG tablet Take by mouth      losartan (COZAAR) 100 MG tablet Take 100 mg by mouth once daily      metoprolol succinate (TOPROL-XL) 100 MG XL tablet Take 100 mg by mouth once daily        No current facility-administered medications on file prior to visit.      Family History History reviewed. No pertinent family history.     Tobacco Use History Social History    Tobacco Use Smoking Status Never Smokeless Tobacco Never      Social History Social History    Socioeconomic History  Marital status: Married Tobacco Use  Smoking status: Never  Smokeless tobacco: Never Substance and Sexual Activity  Alcohol use: Never  Drug use: Never    Social Drivers of Health    Food Insecurity: Low Risk  (10/07/2023)   Received from [facility]   Hunger Vital Sign    Worried About Running Out of Food in the Last Year: Never true    Ran Out of Food in the Last Year: Never true Transportation Needs: No Transportation Needs (10/07/2023)   Received from [facility]    In the  past 12 months, has lack of reliable transportation kept you from medical appointments, meetings, work or from getting things needed for daily living? : No Housing Stability: Unknown (11/19/2023)   Housing Stability Vital Sign    Homeless in the Last Year: No      Objective:     Vitals:   11/19/23 1500 Pulse: 70 Temp: 36.8 C (98.2 F) SpO2: 95% Weight: (!) 120.6 kg (265 lb 12.8 oz) PainSc: 0-No pain   Body mass index is 36.05 kg/m.   Physical Exam Constitutional:      General: He is not in acute distress.    Appearance: Normal appearance.  HENT:     Head: Normocephalic.     Nose: No rhinorrhea.     Mouth/Throat:     Mouth: Mucous membranes are moist.     Pharynx: Oropharynx is clear.  Eyes:     General: No scleral icterus.     Pupils: Pupils are equal, round, and reactive to light.  Cardiovascular:     Rate and Rhythm: Normal rate.     Pulses: Normal pulses.  Pulmonary:     Effort: Pulmonary effort is normal. No respiratory distress.     Breath sounds: No stridor. No wheezing.  Abdominal:     General: Abdomen is flat. There is no distension.     Tenderness: There is no abdominal tenderness. There is no guarding or rebound.     Hernia: A hernia is present. Hernia is present in the umbilical area.  Musculoskeletal:        General: Normal range of motion.     Cervical back: Normal range of motion and neck supple.  Skin:    General: Skin is warm and dry.     Capillary Refill: Capillary refill takes less than 2 seconds.     Coloration: Skin is not jaundiced.  Neurological:     General: No focal deficit present.     Mental Status: He is alert and oriented to person, place, and time. Mental status is at baseline.  Psychiatric:        Mood and Affect: Mood normal.        Thought Content: Thought content normal.        Judgment: Judgment normal.        Hernia Size:4cm Incarcerated: Yes Initial Hernia     Assessment and Plan: Diagnoses and all orders for this visit:   Umbilical hernia without obstruction or gangrene     Michael Moore is a 73 y.o. male    1.  We will proceed to the OR for a lap ventral hernia repair with mesh. 2. All risks and benefits were discussed with the patient, to generally include infection, bleeding, damage to surrounding structures, acute and chronic nerve pain, and recurrence. Alternatives were offered and described.  All questions were answered and the patient voiced understanding of the procedure and wishes to proceed at this point.             No follow-ups on file.   Axel Filler, MD, [facility], [place] [facility]

## 2023-12-31 ENCOUNTER — Other Ambulatory Visit: Payer: Self-pay

## 2023-12-31 ENCOUNTER — Encounter (HOSPITAL_COMMUNITY): Payer: Self-pay

## 2023-12-31 ENCOUNTER — Encounter (HOSPITAL_COMMUNITY)
Admission: RE | Admit: 2023-12-31 | Discharge: 2023-12-31 | Disposition: A | Discharge status: Home / Self Care | Payer: Federal, State, Local not specified - PPO | Source: Ambulatory Visit | Attending: General Surgery | Admitting: General Surgery

## 2023-12-31 VITALS — BP 122/58 | HR 51 | Temp 98.5°F | Resp 18 | Ht 72.0 in | Wt 258.2 lb

## 2023-12-31 DIAGNOSIS — Z01812 Encounter for preprocedural laboratory examination: Secondary | ICD-10-CM | POA: Diagnosis not present

## 2023-12-31 DIAGNOSIS — I10 Essential (primary) hypertension: Secondary | ICD-10-CM | POA: Diagnosis not present

## 2023-12-31 DIAGNOSIS — Z01818 Encounter for other preprocedural examination: Secondary | ICD-10-CM

## 2023-12-31 LAB — SURGICAL PCR SCREEN

## 2023-12-31 LAB — CBC
HCT: 45.8 % (ref 39.0–52.0)
Hemoglobin: 15.3 g/dL (ref 13.0–17.0)
MCH: 30.7 pg (ref 26.0–34.0)
MCHC: 33.4 g/dL (ref 30.0–36.0)
MCV: 92 fL (ref 80.0–100.0)
Platelets: 178 10*3/uL (ref 150–400)
RBC: 4.98 MIL/uL (ref 4.22–5.81)
RDW: 12.9 % (ref 11.5–15.5)
WBC: 5.7 10*3/uL (ref 4.0–10.5)
nRBC: 0 % (ref 0.0–0.2)

## 2023-12-31 LAB — BASIC METABOLIC PANEL
Anion gap: 9 (ref 5–15)
BUN: 21 mg/dL (ref 8–23)
CO2: 27 mmol/L (ref 22–32)
Calcium: 9.3 mg/dL (ref 8.9–10.3)
Chloride: 102 mmol/L (ref 98–111)
Creatinine, Ser: 1.06 mg/dL (ref 0.61–1.24)
GFR, Estimated: >60 mL/min (ref >60)
Glucose, Bld: 113 mg/dL — ABNORMAL HIGH (ref 70–99)
Potassium: 4 mmol/L (ref 3.5–5.1)
Sodium: 138 mmol/L (ref 135–145)

## 2023-12-31 NOTE — Progress Notes (Signed)
   12/31/23 1435  OBSTRUCTIVE SLEEP APNEA  Have you ever been diagnosed with sleep apnea through a sleep study? No  Do you snore loudly (loud enough to be heard through closed doors)?  0  Do you often feel tired, fatigued, or sleepy during the daytime (such as falling asleep during driving or talking to someone)? 0  Has anyone observed you stop breathing during your sleep? 0  Do you have, or are you being treated for high blood pressure? 1  BMI more than 35 kg/m2? 1  Age > 50 (1-yes) 1  Neck circumference greater than:Male 16 inches or larger, Male 17inches or larger? 1  Male Gender (Yes=1) 1  Obstructive Sleep Apnea Score 5

## 2023-12-31 NOTE — Progress Notes (Signed)
 PCP - Dr. Georgann Housekeeper Cardiologist - Dr. Christa See  PPM/ICD - denies   Chest x-ray - 08/11/14 EKG - 07/15/23 Stress Test - denies ECHO - 10/07/23 Cardiac Cath - denies  Sleep Study - denies   DM- denies  Last dose of GLP1 agonist-  n/a   Blood Thinner Instructions: n/a Aspirin Instructions: f/u with surgeon  ERAS Protcol - clears until 0530   COVID TEST- n/a   Anesthesia review: yes, cardiac hx  Patient denies shortness of breath, fever, cough and chest pain at PAT appointment   All instructions explained to the patient, with a verbal understanding of the material. Patient agrees to go over the instructions while at home for a better understanding. The opportunity to ask questions was provided.

## 2023-12-31 NOTE — Progress Notes (Signed)
 Surgical PCR invalid - will need to be repeated on DOS.

## 2024-01-01 LAB — NO BLOOD PRODUCTS

## 2024-01-01 NOTE — Progress Notes (Signed)
 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  Last seen 11/19/23, doing well at that time, no acute concerns. 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  No changes made to management, 1 year follow-up recommended.  Echo 10/07/23 showed EF 65%, bioprosthetic aortic valve mean gradient 7.9 mmHg, no significant valvular stenosis or regurgitation. Stable CT 11/2022.    Preop labs reviewed, WNL.   EKG 07/15/23: Sinus bradycardia. Rate 52.   TTE 10/07/2023 (Care Everywhere): SUMMARY  The left ventricular size is normal.  Mild left ventricular hypertrophy.  LV ejection fraction = 60-65%.  Left ventricular systolic function is normal.  The right ventricle is normal in size and function.  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  vmax 2.1 m-s, mean gradient 7.9 mm Hg  The IVC is normal in size with an inspiratory collapse of greater than 50%, suggesting normal right  atrial pressure.  There is no pericardial effusion.  There is no significant change in comparison with the last study.   CT chest/abdomen 12/26/22 (care everywhere): CONCLUSION:  1. 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  2.  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  3.  Small ventral abdominal wall hernia containing omentum and transverse colon.     Zannie Cove [facility] Stay [facility]/Anesthesiology Phone [phone] 01/01/2024 3:59 PM

## 2024-01-01 NOTE — Anesthesia Preprocedure Evaluation (Addendum)
 Anesthesia Evaluation  Patient identified by MRN, date of birth, ID band Patient awake    Reviewed: Allergy & Precautions, NPO status , Patient's Chart, lab work & pertinent test results, reviewed documented beta blocker date and time   Airway Mallampati: IV  TM Distance: >3 FB Neck ROM: Full    Dental  (+) Teeth Intact, Dental Advisory Given   Pulmonary neg pulmonary ROS   Pulmonary exam normal breath sounds clear to auscultation       Cardiovascular hypertension (140/70 preop), Pt. on medications and Pt. on home beta blockers Normal cardiovascular exam+ Valvular Problems/Murmurs  Rhythm:Regular Rate:Normal  2008 aortic root replacement/AVR for dissection   Last seen 11/19/23, doing well at that time, no acute concerns. 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  No changes made to management, 1 year follow-up recommended.  Echo 10/07/23 showed EF 65%, bioprosthetic aortic valve mean gradient 7.9 mmHg, no significant valvular stenosis or regurgitation. Stable CT 11/2022.    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  2. 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  3. Small ventral abdominal wall hernia containing omentum and transverse colon.    Neuro/Psych negative neurological ROS  negative psych ROS   GI/Hepatic Neg liver ROS,,,Incarcerated ventral hernia    Endo/Other  negative endocrine ROS  Obesity BMI 35  Renal/GU negative Renal ROS  negative genitourinary   Musculoskeletal  (+) Arthritis , Osteoarthritis,    Abdominal  (+) + obese  Peds  Hematology negative hematology ROS (+) Hb 15.3   Anesthesia Other Findings   Reproductive/Obstetrics negative OB ROS                              Anesthesia Physical Anesthesia Plan  ASA: 2  Anesthesia Plan: General   Post-op Pain Management: Tylenol PO (pre-op)* and Ketamine IV*   Induction: Intravenous  PONV Risk Score and Plan: 3 and Ondansetron, Dexamethasone and Treatment may vary due to age or medical condition  Airway Management Planned: Oral ETT  Additional Equipment: None  Intra-op Plan:   Post-operative Plan: Extubation in OR  Informed Consent: I have reviewed the patients History and Physical, chart, labs and discussed the procedure including the risks, benefits and alternatives for the proposed anesthesia with the patient or authorized representative who has indicated his/her understanding and acceptance.     Dental advisory given  Plan Discussed with: CRNA  Anesthesia Plan Comments: (Last airway 2015: Ventilation: Mask ventilation without difficulty Laryngoscope Size: Mac and 4 Grade View: Grade II Tube type: Oral Tube size: 7.5 mm Number of attempts: 1 )        Anesthesia Quick Evaluation

## 2024-01-08 ENCOUNTER — Encounter (HOSPITAL_COMMUNITY)
Admission: RE | Disposition: A | Discharge status: Home / Self Care | Payer: Self-pay | Source: Home / Self Care | Attending: General Surgery

## 2024-01-08 ENCOUNTER — Ambulatory Visit (HOSPITAL_COMMUNITY)
Admission: RE | Admit: 2024-01-08 | Discharge: 2024-01-08 | Disposition: A | Discharge status: Home / Self Care | Payer: Federal, State, Local not specified - PPO | Attending: General Surgery | Admitting: General Surgery

## 2024-01-08 ENCOUNTER — Ambulatory Visit (HOSPITAL_COMMUNITY): Payer: Self-pay | Admitting: Physician Assistant

## 2024-01-08 ENCOUNTER — Other Ambulatory Visit: Payer: Self-pay

## 2024-01-08 ENCOUNTER — Ambulatory Visit (HOSPITAL_BASED_OUTPATIENT_CLINIC_OR_DEPARTMENT_OTHER): Payer: Self-pay | Admitting: Certified Registered Nurse Anesthetist

## 2024-01-08 ENCOUNTER — Encounter (HOSPITAL_COMMUNITY): Payer: Self-pay | Admitting: General Surgery

## 2024-01-08 DIAGNOSIS — E6689 Other obesity not elsewhere classified: Secondary | ICD-10-CM | POA: Insufficient documentation

## 2024-01-08 DIAGNOSIS — K436 Other and unspecified ventral hernia with obstruction, without gangrene: Secondary | ICD-10-CM | POA: Diagnosis not present

## 2024-01-08 DIAGNOSIS — M1990 Unspecified osteoarthritis, unspecified site: Secondary | ICD-10-CM | POA: Insufficient documentation

## 2024-01-08 DIAGNOSIS — Z6835 Body mass index (BMI) 35.0-35.9, adult: Secondary | ICD-10-CM | POA: Insufficient documentation

## 2024-01-08 DIAGNOSIS — Z79899 Other long term (current) drug therapy: Secondary | ICD-10-CM | POA: Diagnosis not present

## 2024-01-08 DIAGNOSIS — I10 Essential (primary) hypertension: Secondary | ICD-10-CM | POA: Diagnosis not present

## 2024-01-08 DIAGNOSIS — Z01818 Encounter for other preprocedural examination: Secondary | ICD-10-CM

## 2024-01-08 HISTORY — PX: VENTRAL HERNIA REPAIR: SHX424

## 2024-01-08 LAB — SURGICAL PCR SCREEN
MRSA, PCR: NEGATIVE
Staphylococcus aureus: NEGATIVE

## 2024-01-08 SURGERY — REPAIR, HERNIA, VENTRAL, LAPAROSCOPIC
Anesthesia: General | Site: Abdomen

## 2024-01-08 MED ORDER — FENTANYL CITRATE (PF) 250 MCG/5ML IJ SOLN
INTRAMUSCULAR | Status: DC | PRN
  Administered 2024-01-08: 50 ug via INTRAVENOUS
  Administered 2024-01-08: 100 ug via INTRAVENOUS

## 2024-01-08 MED ORDER — TRAMADOL HCL 50 MG PO TABS: 50.0000 mg | ORAL_TABLET | Freq: Four times a day (QID) | ORAL | 0 refills | Status: DC | PRN

## 2024-01-08 MED ORDER — LACTATED RINGERS IV SOLN: INTRAVENOUS | Status: DC

## 2024-01-08 MED ORDER — CEFAZOLIN SODIUM-DEXTROSE 2-4 GM/100ML-% IV SOLN
INTRAVENOUS | Status: AC
  Filled 2024-01-08: qty 100

## 2024-01-08 MED ORDER — DEXAMETHASONE SODIUM PHOSPHATE 10 MG/ML IJ SOLN
INTRAMUSCULAR | Status: DC | PRN
  Administered 2024-01-08: 10 mg via INTRAVENOUS

## 2024-01-08 MED ORDER — ACETAMINOPHEN 500 MG PO TABS: 1000.0000 mg | ORAL_TABLET | Freq: Once | ORAL | Status: AC

## 2024-01-08 MED ORDER — ONDANSETRON HCL 4 MG/2ML IJ SOLN
INTRAMUSCULAR | Status: DC | PRN
  Administered 2024-01-08: 4 mg via INTRAVENOUS

## 2024-01-08 MED ORDER — ONDANSETRON HCL 4 MG/2ML IJ SOLN: 4.0000 mg | Freq: Once | INTRAMUSCULAR | Status: DC | PRN

## 2024-01-08 MED ORDER — OXYCODONE HCL 5 MG/5ML PO SOLN: 5.0000 mg | Freq: Once | ORAL | Status: AC | PRN

## 2024-01-08 MED ORDER — CHLORHEXIDINE GLUCONATE 0.12 % MT SOLN: 15.0000 mL | Freq: Once | OROMUCOSAL | Status: AC

## 2024-01-08 MED ORDER — CHLORHEXIDINE GLUCONATE 0.12 % MT SOLN
OROMUCOSAL | Status: AC
  Administered 2024-01-08: 15 mL via OROMUCOSAL
  Filled 2024-01-08: qty 15

## 2024-01-08 MED ORDER — HYDROMORPHONE HCL 1 MG/ML IJ SOLN: 0.2500 mg | INTRAMUSCULAR | Status: DC | PRN

## 2024-01-08 MED ORDER — ROCURONIUM BROMIDE 10 MG/ML (PF) SYRINGE
PREFILLED_SYRINGE | INTRAVENOUS | Status: DC | PRN
  Administered 2024-01-08: 100 mg via INTRAVENOUS

## 2024-01-08 MED ORDER — ORAL CARE MOUTH RINSE: 15.0000 mL | Freq: Once | OROMUCOSAL | Status: AC

## 2024-01-08 MED ORDER — PROPOFOL 10 MG/ML IV BOLUS
INTRAVENOUS | Status: DC | PRN
  Administered 2024-01-08: 200 mg via INTRAVENOUS

## 2024-01-08 MED ORDER — LIDOCAINE 2% (20 MG/ML) 5 ML SYRINGE
INTRAMUSCULAR | Status: DC | PRN
  Administered 2024-01-08: 100 mg via INTRAVENOUS

## 2024-01-08 MED ORDER — DEXAMETHASONE SODIUM PHOSPHATE 10 MG/ML IJ SOLN
INTRAMUSCULAR | Status: AC
  Filled 2024-01-08: qty 1

## 2024-01-08 MED ORDER — ACETAMINOPHEN 500 MG PO TABS: 1000.0000 mg | ORAL_TABLET | Freq: Once | ORAL | Status: DC

## 2024-01-08 MED ORDER — PROPOFOL 10 MG/ML IV BOLUS
INTRAVENOUS | Status: AC
  Filled 2024-01-08: qty 20

## 2024-01-08 MED ORDER — CHLORHEXIDINE GLUCONATE CLOTH 2 % EX PADS: 6.0000 | MEDICATED_PAD | Freq: Once | CUTANEOUS | Status: DC

## 2024-01-08 MED ORDER — OXYCODONE HCL 5 MG PO TABS
ORAL_TABLET | ORAL | Status: AC
  Filled 2024-01-08: qty 1

## 2024-01-08 MED ORDER — ONDANSETRON HCL 4 MG/2ML IJ SOLN
INTRAMUSCULAR | Status: AC
  Filled 2024-01-08: qty 2

## 2024-01-08 MED ORDER — OXYCODONE HCL 5 MG PO TABS
5.0000 mg | ORAL_TABLET | Freq: Once | ORAL | Status: AC | PRN
  Administered 2024-01-08: 5 mg via ORAL

## 2024-01-08 MED ORDER — AMISULPRIDE (ANTIEMETIC) 5 MG/2ML IV SOLN: 10.0000 mg | Freq: Once | INTRAVENOUS | Status: DC | PRN

## 2024-01-08 MED ORDER — PHENYLEPHRINE HCL-NACL 20-0.9 MG/250ML-% IV SOLN
INTRAVENOUS | Status: DC | PRN
Start: 2024-01-08 — End: 2024-01-08
  Administered 2024-01-08: 50 ug/min via INTRAVENOUS

## 2024-01-08 MED ORDER — SUGAMMADEX SODIUM 200 MG/2ML IV SOLN
INTRAVENOUS | Status: DC | PRN
  Administered 2024-01-08: 300 mg via INTRAVENOUS

## 2024-01-08 MED ORDER — BUPIVACAINE HCL (PF) 0.25 % IJ SOLN
INTRAMUSCULAR | Status: AC
  Filled 2024-01-08: qty 30

## 2024-01-08 MED ORDER — LIDOCAINE 2% (20 MG/ML) 5 ML SYRINGE
INTRAMUSCULAR | Status: AC
  Filled 2024-01-08: qty 5

## 2024-01-08 MED ORDER — EPHEDRINE SULFATE-NACL 50-0.9 MG/10ML-% IV SOSY
PREFILLED_SYRINGE | INTRAVENOUS | Status: DC | PRN
  Administered 2024-01-08: 10 mg via INTRAVENOUS
  Administered 2024-01-08: 5 mg via INTRAVENOUS

## 2024-01-08 MED ORDER — CEFAZOLIN SODIUM-DEXTROSE 2-4 GM/100ML-% IV SOLN
2.0000 g | INTRAVENOUS | Status: AC
  Administered 2024-01-08: 2 g via INTRAVENOUS

## 2024-01-08 MED ORDER — SUGAMMADEX SODIUM 200 MG/2ML IV SOLN
INTRAVENOUS | Status: AC
  Filled 2024-01-08: qty 4

## 2024-01-08 MED ORDER — ACETAMINOPHEN 500 MG PO TABS
ORAL_TABLET | ORAL | Status: AC
  Administered 2024-01-08: 1000 mg via ORAL
  Filled 2024-01-08: qty 2

## 2024-01-08 MED ORDER — ROCURONIUM BROMIDE 10 MG/ML (PF) SYRINGE
PREFILLED_SYRINGE | INTRAVENOUS | Status: AC
  Filled 2024-01-08: qty 10

## 2024-01-08 MED ORDER — FENTANYL CITRATE (PF) 250 MCG/5ML IJ SOLN
INTRAMUSCULAR | Status: AC
Start: 2024-01-08 — End: ?
  Filled 2024-01-08: qty 5

## 2024-01-08 MED ORDER — ACETAMINOPHEN 500 MG PO TABS: 1000.0000 mg | ORAL_TABLET | ORAL | Status: DC

## 2024-01-08 MED ORDER — BUPIVACAINE HCL 0.25 % IJ SOLN
INTRAMUSCULAR | Status: DC | PRN
  Administered 2024-01-08: 6 mL

## 2024-01-08 SURGICAL SUPPLY — 41 items
APPLIER CLIP 5 13 M/L LIGAMAX5 (MISCELLANEOUS) IMPLANT
BAG COUNTER SPONGE SURGICOUNT (BAG) ×1 IMPLANT
CANISTER SUCT 3000ML PPV (MISCELLANEOUS) IMPLANT
CHLORAPREP W/TINT 26 (MISCELLANEOUS) ×1 IMPLANT
CLIP APPLIE 5 13 M/L LIGAMAX5 (MISCELLANEOUS) IMPLANT
COVER SURGICAL LIGHT HANDLE (MISCELLANEOUS) ×1 IMPLANT
DERMABOND ADVANCED .7 DNX12 (GAUZE/BANDAGES/DRESSINGS) ×1 IMPLANT
DEVICE SECURE STRAP 25 ABSORB (INSTRUMENTS) ×1 IMPLANT
DEVICE TROCAR PUNCTURE CLOSURE (ENDOMECHANICALS) ×1 IMPLANT
ELECT REM PT RETURN 9FT ADLT (ELECTROSURGICAL) ×1 IMPLANT
ELECTRODE REM PT RTRN 9FT ADLT (ELECTROSURGICAL) ×1 IMPLANT
GLOVE BIO SURGEON STRL SZ7.5 (GLOVE) ×2 IMPLANT
GOWN STRL REUS W/ TWL LRG LVL3 (GOWN DISPOSABLE) ×2 IMPLANT
GOWN STRL REUS W/ TWL XL LVL3 (GOWN DISPOSABLE) ×1 IMPLANT
IRRIG SUCT STRYKERFLOW 2 WTIP (MISCELLANEOUS) IMPLANT
IRRIGATION SUCT STRKRFLW 2 WTP (MISCELLANEOUS) IMPLANT
KIT BASIN OR (CUSTOM PROCEDURE TRAY) ×1 IMPLANT
KIT TURNOVER KIT B (KITS) ×1 IMPLANT
MESH VENTRALIGHT ST 6IN CRC (Mesh General) IMPLANT
NDL INSUFFLATION 14GA 120MM (NEEDLE) ×1 IMPLANT
NEEDLE INSUFFLATION 14GA 120MM (NEEDLE) ×1 IMPLANT
NS IRRIG 1000ML POUR BTL (IV SOLUTION) ×1 IMPLANT
PAD ARMBOARD 7.5X6 YLW CONV (MISCELLANEOUS) ×2 IMPLANT
POUCH LAPAROSCOPIC INSTRUMENT (MISCELLANEOUS) ×1 IMPLANT
SCISSORS LAP 5X35 DISP (ENDOMECHANICALS) ×1 IMPLANT
SET TUBE SMOKE EVAC HIGH FLOW (TUBING) ×1 IMPLANT
SHEARS HARMONIC ACE PLUS 36CM (ENDOMECHANICALS) IMPLANT
SLEEVE Z-THREAD 5X100MM (TROCAR) ×1 IMPLANT
SUT CHROMIC 2 0 SH (SUTURE) ×1 IMPLANT
SUT ETHIBOND 0 MO6 C/R (SUTURE) IMPLANT
SUT MNCRL AB 4-0 PS2 18 (SUTURE) ×1 IMPLANT
SUT NOVA 1 T20/GS 25DT (SUTURE) IMPLANT
SUT PROLENE 2 0 KS (SUTURE) IMPLANT
TOWEL GREEN STERILE (TOWEL DISPOSABLE) ×1 IMPLANT
TOWEL GREEN STERILE FF (TOWEL DISPOSABLE) ×1 IMPLANT
TRAY FOLEY W/BAG SLVR 16FR ST (SET/KITS/TRAYS/PACK) IMPLANT
TRAY LAPAROSCOPIC MC (CUSTOM PROCEDURE TRAY) ×1 IMPLANT
TROCAR 11X100 Z THREAD (TROCAR) IMPLANT
TROCAR Z-THREAD OPTICAL 5X100M (TROCAR) ×1 IMPLANT
WARMER LAPAROSCOPE (MISCELLANEOUS) ×1 IMPLANT
WATER STERILE IRR 1000ML POUR (IV SOLUTION) ×1 IMPLANT

## 2024-01-08 NOTE — Interval H&P Note (Signed)
 History and Physical Interval Note:  01/08/2024 8:24 AM  Michael Moore  has presented today for surgery, with the diagnosis of INCARCERATED VENTRAL HERNIA.  The various methods of treatment have been discussed with the patient and family. After consideration of risks, benefits and other options for treatment, the patient has consented to  Procedure(s): LAPAROSCOPIC VENTRAL HERNIA REPAIR WITH MESH (N/A) as a surgical intervention.  The patient's history has been reviewed, patient examined, no change in status, stable for surgery.  I have reviewed the patient's chart and labs.  Questions were answered to the patient's satisfaction.     Axel Filler

## 2024-01-08 NOTE — Transfer of Care (Signed)
 Immediate Anesthesia Transfer of Care Note  Patient: MALAKI KOURY  Procedure(s) Performed: LAPAROSCOPIC VENTRAL HERNIA REPAIR WITH MESH (Abdomen)  Patient Location: PACU  Anesthesia Type:General  Level of Consciousness: awake, alert , and oriented  Airway & Oxygen Therapy: Patient Spontanous Breathing  Post-op Assessment: Report given to RN and Post -op Vital signs reviewed and stable  Post vital signs: Reviewed and stable  Last Vitals:  Vitals Value Taken Time  BP 138/66 01/08/24 0922  Temp 36.4 C 01/08/24 0922  Pulse 43 01/08/24 0925  Resp 11 01/08/24 0925  SpO2 96 % 01/08/24 0925  Vitals shown include unfiled device data.  Last Pain:  Vitals:   01/08/24 0729  TempSrc:   PainSc: 0-No pain         Complications: No notable events documented.

## 2024-01-08 NOTE — Discharge Instructions (Signed)

## 2024-01-08 NOTE — Anesthesia Postprocedure Evaluation (Signed)
 Anesthesia Post Note  Patient: AMOUS CREWE  Procedure(s) Performed: LAPAROSCOPIC VENTRAL HERNIA REPAIR WITH MESH (Abdomen)     Patient location during evaluation: PACU Anesthesia Type: General Level of consciousness: awake and alert, oriented and patient cooperative Pain management: pain level controlled Vital Signs Assessment: post-procedure vital signs reviewed and stable Respiratory status: spontaneous breathing, nonlabored ventilation and respiratory function stable Cardiovascular status: blood pressure returned to baseline and stable Postop Assessment: no apparent nausea or vomiting Anesthetic complications: no  No notable events documented.  Last Vitals:  Vitals:   01/08/24 0945 01/08/24 0952  BP: 126/64 127/63  Pulse: (!) 44 (!) 41  Resp: 13 12  Temp:  36.4 C  SpO2: 93% 92%    Last Pain:  Vitals:   01/08/24 0952  TempSrc:   PainSc: 3                  Lannie Fields

## 2024-01-08 NOTE — Op Note (Signed)
 01/08/2024  9:12 AM  PATIENT:  Michael Moore  73 y.o. male  PRE-OPERATIVE DIAGNOSIS:  INCARCERATED VENTRAL HERNIA 3cm  POST-OPERATIVE DIAGNOSIS:  INCARCERATED VENTRAL HERNIA 3cm  PROCEDURE:  Procedure(s): LAPAROSCOPIC VENTRAL HERNIA REPAIR WITH MESH (N/A)  SURGEON:  Surgeons and Role:    * Axel Filler, MD - Primary  ASSISTANTS: Gevena Cotton, RN   ANESTHESIA:   local and general  EBL:  minimal   BLOOD ADMINISTERED:none  DRAINS: none   LOCAL MEDICATIONS USED:  BUPIVICAINE   SPECIMEN:  No Specimen  DISPOSITION OF SPECIMEN:  N/A  COUNTS:  YES  TOURNIQUET:  * No tourniquets in log *  DICTATION: .Dragon Dictation Findings: 3cm incarcerated ventral hernia  Details of the procedure:  After the patient was consented patient was taken back to the operating room patient was then placed in supine position bilateral SCDs in place.  The patient was prepped and draped in the usual sterile fashion. After antibiotics were confirmed a timeout was called and all facts were verified. The Veress needle technique was used to insuflate the abdomen at Palmer's point. The abdomen was insufflated to 14 mm mercury. Subsequently a 5 mm trocar was placed a camera inserted there was no injury to any intra-abdominal organs.    There was seen to be an incarcerated  3cm umbilical hernia.  A second camera port was in placed into the left lower quadrant.   At this the Falicform ligament was taken down with Bovie cautery maintaining hemostasis. A 5mm port was placed in the epigastrium .   I proceeded to reduce the hernia contents.  The hernia sac was dissected out of the hernia and disposed.  The fascia at the hernia was reapproximated using a #0 Ethibonds x 3.  Once the hernia was cleared away, a Bard Ventralight 15.2cm  mesh was inserted into the abdomen.  The mesh was secured circumferentially with am Securestrap tacker in a double crown fashion.    The omentum was brought over the area of the  mesh. The fascia at the left lower quadrant port site was reapproximated with a 0 vicryl and an endoclose device.  The pneumoperitoneum was evacuated & all trocars  were removed. The skin was reapproximated with 4-0  Monocryl sutures in a subcuticular fashion. The skin was dressed with Dermabond.  The patient was taken to the recovery room in stable condition.  Type of repair -primary suture & mesh  Mesh overlap - 6cm  Placement of mesh -  beneath fascia and into peritoneal cavity  Size: 3-10cm, Primary Hernia, and Incarcerated Hernia    PLAN OF CARE: Discharge to home after PACU  PATIENT DISPOSITION:  PACU - hemodynamically stable.   Delay start of Pharmacological VTE agent (>24hrs) due to surgical blood loss or risk of bleeding: not applicable

## 2024-01-08 NOTE — Anesthesia Procedure Notes (Signed)
 Procedure Name: Intubation Date/Time: 01/08/2024 8:35 AM  Performed by: Sudie Grumbling, CRNAPre-anesthesia Checklist: Patient identified, Emergency Drugs available, Suction available and Patient being monitored Patient Re-evaluated:Patient Re-evaluated prior to induction Oxygen Delivery Method: Circle system utilized Preoxygenation: Pre-oxygenation with 100% oxygen Induction Type: IV induction Ventilation: Mask ventilation without difficulty Laryngoscope Size: Glidescope and 4 Grade View: Grade I Tube type: Oral Tube size: 7.5 mm Number of attempts: 1 Airway Equipment and Method: Stylet and Oral airway Placement Confirmation: ETT inserted through vocal cords under direct vision, positive ETCO2 and breath sounds checked- equal and bilateral Secured at: 22 cm Tube secured with: Tape Dental Injury: Teeth and Oropharynx as per pre-operative assessment

## 2024-01-09 ENCOUNTER — Encounter (HOSPITAL_COMMUNITY): Payer: Self-pay | Admitting: General Surgery

## 2024-02-03 ENCOUNTER — Ambulatory Visit (INDEPENDENT_AMBULATORY_CARE_PROVIDER_SITE_OTHER): Payer: Federal, State, Local not specified - PPO | Admitting: Orthopedic Surgery

## 2024-02-03 DIAGNOSIS — M67432 Ganglion, left wrist: Secondary | ICD-10-CM | POA: Diagnosis not present

## 2024-02-03 NOTE — Progress Notes (Signed)
 [place] DUCRE - 73 y.o. male MRN [id number]  Date of birth: 1951-02-21  Office Visit Note: Visit Date: 02/03/2024 PCP: Georgann Housekeeper, MD Referred by: Georgann Housekeeper, MD  Subjective: No chief complaint on file.  HPI: Michael Moore is a pleasant 73 y.o. male who presents today for repeat discussion of a left wrist volar ganglion cyst that is been present now for approximately 1 month.  He denies any significant pain in this region, has maintained good range of motion of the wrist without significant restriction.  Does have some slight numbness in the volar palmar aspect of the hand, denies any numbness in the fingers.  He underwent recent abdominal hernia surgery last month and is currently recovering from this quite well.  He would like to discuss timing regarding the volar ganglion cyst excision for later this year.  Pertinent ROS were reviewed with the patient and found to be negative unless otherwise specified above in HPI.     Assessment & Plan: Visit Diagnoses:  1. Ganglion cyst of volar aspect of left wrist      Plan: Extensive discussion was had with the patient today regarding his left wrist volar ganglion cyst.  We discussed the underlying pathophysiology of this condition as well as potential treatment options.  We discussed potential observation versus surgical excision.   We discussed the surgical approach and process for volar ganglion cyst excision at the wrist as well as the postoperative recovery.  Risks and benefits of the procedure were discussed, risks including but not limited to infection, bleeding, scarring, stiffness, nerve injury, tendon injury, vascular injury, recurrence of symptoms and need for subsequent operation.  Patient expressed understanding.    He would like to move forward with surgical excision later this year, will contact our office with timing per his preference.  He was considering September of this year, I did recommend that he return to  me 1 month prior for repeat evaluation and discussion prior to surgical scheduling.   Follow-up: No follow-ups on file.   Meds & Orders: No orders of the defined types were placed in this encounter.   No orders of the defined types were placed in this encounter.    Procedures: No procedures performed      Clinical History: No specialty comments available.  He reports that he has never smoked. He has never used smokeless tobacco. No results for input(s): "HGBA1C", "LABURIC" in the last 8760 hours.  Objective:   Vital Signs: There were no vitals taken for this visit.  Physical Exam  Gen: Well-appearing, in no acute distress; non-toxic CV: Regular Rate. Well-perfused. Warm.  Resp: Breathing unlabored on room air; no wheezing. Psych: Fluid speech in conversation; appropriate affect; normal thought process  Ortho Exam Left wrist: - Notable volar mass, soft, compressible, mobile, transilluminates, measures approximately 2 cm x 2.5 cm, multilobulated - Palpable radial and ulnar pulses at the wrist, normal Allen's testing with refill of both radial and ulnar circulations at the hand - Mildly positive Tinel's over the cyst, sensation is intact to the distal aspect of the thumb index and long finger to light touch, slight area of numbness over the palmar volar aspect of the hand, likely in the distribution of the palmar cutaneous branch  Imaging: No results found.   Past Medical/Family/Surgical/Social History: Medications & Allergies reviewed per EMR, new medications updated. 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   Hypertension    Umbilical hernia    Family History  Problem Relation Age of Onset   Heart attack Mother    Heart attack Maternal Grandmother    Heart attack Maternal Grandfather    Stroke Paternal Grandmother    Stroke Paternal Grandfather    Heart attack Brother    Heart attack Sister    Past Surgical History:  Procedure Laterality Date   AORTIC ROOT REPLACEMENT  04/07/2007   aortic dissection repair and AVR   COLONOSCOPY     FOOT SURGERY Left 09/03/2005   HERNIA REPAIR     left in 03/1984; right in 03/1977   TOTAL HIP ARTHROPLASTY Right 08/25/2014   Procedure: TOTAL HIP ARTHROPLASTY;  Surgeon: Nadara Mustard, MD;  Location: [facility] OR;  Service: Orthopedics;  Laterality: Right;  Right Total Hip Arthroplasty   VENTRAL HERNIA REPAIR N/A 01/08/2024   Procedure: LAPAROSCOPIC VENTRAL HERNIA REPAIR WITH MESH;  Surgeon: Axel Filler, MD;  Location: [facility] OR;  Service: General;  Laterality: N/A;   Social History   Occupational History   Not on file  Tobacco Use   Smoking status: Never   Smokeless tobacco: Never  Vaping Use   Vaping status: Never Used  Substance and Sexual Activity   Alcohol use: Yes    Alcohol/week: 3.0 standard drinks of alcohol    Types: 3 Glasses of wine per week   Drug use: No   Sexual activity: Not on file    Krystyn Picking Fara Boros) Denese Killings, M.D. [facility]

## 2024-02-21 DIAGNOSIS — Z9889 Other specified postprocedural states: Secondary | ICD-10-CM | POA: Diagnosis not present

## 2024-02-21 DIAGNOSIS — Z8719 Personal history of other diseases of the digestive system: Secondary | ICD-10-CM | POA: Diagnosis not present

## 2024-02-26 DIAGNOSIS — E782 Mixed hyperlipidemia: Secondary | ICD-10-CM | POA: Diagnosis not present

## 2024-02-26 DIAGNOSIS — I10 Essential (primary) hypertension: Secondary | ICD-10-CM | POA: Diagnosis not present

## 2024-03-16 ENCOUNTER — Ambulatory Visit (INDEPENDENT_AMBULATORY_CARE_PROVIDER_SITE_OTHER): Payer: Federal, State, Local not specified - PPO | Admitting: Orthopedic Surgery

## 2024-03-16 DIAGNOSIS — I872 Venous insufficiency (chronic) (peripheral): Secondary | ICD-10-CM

## 2024-03-16 DIAGNOSIS — D171 Benign lipomatous neoplasm of skin and subcutaneous tissue of trunk: Secondary | ICD-10-CM | POA: Diagnosis not present

## 2024-03-16 DIAGNOSIS — B351 Tinea unguium: Secondary | ICD-10-CM

## 2024-03-17 ENCOUNTER — Encounter: Payer: Self-pay | Admitting: Orthopedic Surgery

## 2024-03-17 NOTE — Progress Notes (Signed)
 Office Visit Note   Patient: Michael Moore           Date of Birth: [birth year]           MRN: [id number] Visit Date: 03/16/2024              Requested by: Jearldine Mina, MD [address] [place],  [place] [address] PCP: Jearldine Mina, MD  Chief Complaint  Patient presents with   Right Foot - Follow-up   Left Foot - Follow-up      HPI: Patient is a 73 year old gentleman who was seen in follow-up for both lower extremities with venous stasis insufficiency and onychomycotic nails.  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  Assessment & Plan: Visit Diagnoses:  1. Onychomycosis   2. Venous stasis dermatitis of both lower extremities   3. Lipoma of back     Plan: Recommended continue observation for the lipoma.  No treatment necessary for the venous stasis changes.  Nails trimmed x 10.  Follow-Up Instructions: Return in about 3 months (around 06/16/2024).   Ortho Exam  Patient is alert, oriented, no adenopathy, well-dressed, normal affect, normal respiratory effort. Examination patient has a soft mobile lipoma on the posterior aspect of his neck that is essentially unchanged.  No skin color changes no ulceration.  Examination of his legs there is brawny skin color changes without ulceration.  Patient has thickened discolored onychomycotic nails that he is unable to safely trim on his own and the nails were trimmed x 10 without complication.  Imaging: No results found. No images are attached to the encounter.  Labs: No results found for: "HGBA1C", "ESRSEDRATE", "CRP", "LABURIC", "REPTSTATUS", "GRAMSTAIN", "CULT", "LABORGA"   Lab Results  Component Value Date   ALBUMIN 3.7 08/11/2014    No results found for: "MG" No results found for: "VD25OH"  No results found for: "PREALBUMIN"    Latest Ref Rng & Units 12/31/2023    2:15 PM 07/15/2023    2:41 PM 07/23/2020   10:10 PM  CBC EXTENDED  WBC 4.0 - 10.5 K/uL 5.7  5.1   7.3   RBC 4.22 - 5.81 MIL/uL 4.98  5.49  4.63   Hemoglobin 13.0 - 17.0 g/dL 78.2  95.6  21.3   HCT 39.0 - 52.0 % 45.8  50.9  43.1   Platelets 150 - 400 K/uL 178  187  183      There is no height or weight on file to calculate BMI.  Orders:  No orders of the defined types were placed in this encounter.  No orders of the defined types were placed in this encounter.    Procedures: No procedures performed  Clinical Data: No additional findings.  ROS:  All other systems negative, except as noted in the HPI. Review of Systems  Objective: Vital Signs: There were no vitals taken for this visit.  Specialty Comments:  No specialty comments available.  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   Essential hypertension 04/13/2014   History of aortic valve replacement 04/13/2014   Status post left foot surgery 04/13/2014   Past Medical History:  Diagnosis Date   Arthritis    right hip   Dysrhythmia    palpitations   History of inguinal hernia repair, bilateral    History of methicillin resistant staphylococcus aureus (MRSA)    to foot   Hx  of aortic root repair    dissection repair and AVR in 2008   Hypertension    Umbilical hernia     Family History  Problem Relation Age of Onset   Heart attack Mother    Heart attack Maternal Grandmother    Heart attack Maternal Grandfather    Stroke Paternal Grandmother    Stroke Paternal Grandfather    Heart attack Brother    Heart attack Sister     Past Surgical History:  Procedure Laterality Date   AORTIC ROOT REPLACEMENT  04/07/2007   aortic dissection repair and AVR   COLONOSCOPY     FOOT SURGERY Left 09/03/2005   HERNIA REPAIR     left in 03/1984; right in 03/1977   TOTAL HIP ARTHROPLASTY Right 08/25/2014   Procedure: TOTAL HIP ARTHROPLASTY;  Surgeon: Timothy Ford, MD;  Location: [facility] OR;  Service: Orthopedics;  Laterality: Right;  Right Total Hip  Arthroplasty   VENTRAL HERNIA REPAIR N/A 01/08/2024   Procedure: LAPAROSCOPIC VENTRAL HERNIA REPAIR WITH MESH;  Surgeon: Shela Derby, MD;  Location: [facility] OR;  Service: General;  Laterality: N/A;   Social History   Occupational History   Not on file  Tobacco Use   Smoking status: Never   Smokeless tobacco: Never  Vaping Use   Vaping status: Never Used  Substance and Sexual Activity   Alcohol use: Yes    Alcohol/week: 3.0 standard drinks of alcohol    Types: 3 Glasses of wine per week   Drug use: No   Sexual activity: Not on file

## 2024-04-02 DIAGNOSIS — M542 Cervicalgia: Secondary | ICD-10-CM | POA: Diagnosis not present

## 2024-04-02 DIAGNOSIS — R42 Dizziness and giddiness: Secondary | ICD-10-CM | POA: Diagnosis not present

## 2024-04-02 DIAGNOSIS — I10 Essential (primary) hypertension: Secondary | ICD-10-CM | POA: Diagnosis not present

## 2024-05-15 DIAGNOSIS — R972 Elevated prostate specific antigen [PSA]: Secondary | ICD-10-CM | POA: Diagnosis not present

## 2024-05-15 DIAGNOSIS — Z6836 Body mass index (BMI) 36.0-36.9, adult: Secondary | ICD-10-CM | POA: Diagnosis not present

## 2024-05-15 DIAGNOSIS — Z0000 Encounter for general adult medical examination without abnormal findings: Secondary | ICD-10-CM | POA: Diagnosis not present

## 2024-05-15 DIAGNOSIS — I10 Essential (primary) hypertension: Secondary | ICD-10-CM | POA: Diagnosis not present

## 2024-05-15 DIAGNOSIS — Z8679 Personal history of other diseases of the circulatory system: Secondary | ICD-10-CM | POA: Diagnosis not present

## 2024-05-15 DIAGNOSIS — Z23 Encounter for immunization: Secondary | ICD-10-CM | POA: Diagnosis not present

## 2024-05-15 DIAGNOSIS — E782 Mixed hyperlipidemia: Secondary | ICD-10-CM | POA: Diagnosis not present

## 2024-05-15 DIAGNOSIS — I872 Venous insufficiency (chronic) (peripheral): Secondary | ICD-10-CM | POA: Diagnosis not present

## 2024-05-15 DIAGNOSIS — R7303 Prediabetes: Secondary | ICD-10-CM | POA: Diagnosis not present

## 2024-05-15 DIAGNOSIS — D179 Benign lipomatous neoplasm, unspecified: Secondary | ICD-10-CM | POA: Diagnosis not present

## 2024-05-15 DIAGNOSIS — J309 Allergic rhinitis, unspecified: Secondary | ICD-10-CM | POA: Diagnosis not present

## 2024-05-20 DIAGNOSIS — R972 Elevated prostate specific antigen [PSA]: Secondary | ICD-10-CM | POA: Diagnosis not present

## 2024-05-27 DIAGNOSIS — R972 Elevated prostate specific antigen [PSA]: Secondary | ICD-10-CM | POA: Diagnosis not present

## 2024-05-27 DIAGNOSIS — R3912 Poor urinary stream: Secondary | ICD-10-CM | POA: Diagnosis not present

## 2024-05-27 DIAGNOSIS — N5201 Erectile dysfunction due to arterial insufficiency: Secondary | ICD-10-CM | POA: Diagnosis not present

## 2024-05-27 DIAGNOSIS — N401 Enlarged prostate with lower urinary tract symptoms: Secondary | ICD-10-CM | POA: Diagnosis not present

## 2024-06-08 ENCOUNTER — Ambulatory Visit: Admitting: Orthopedic Surgery

## 2024-06-08 DIAGNOSIS — M67432 Ganglion, left wrist: Secondary | ICD-10-CM | POA: Diagnosis not present

## 2024-06-08 NOTE — Progress Notes (Signed)
 Michael Moore - 73 y.o. male MRN [id number]  Date of birth: June 20, 1951  Office Visit Note: Visit Date: 06/08/2024 PCP: Ransom Other, MD Referred by: Ransom Other, MD  Subjective: No chief complaint on file.  HPI: Michael Moore is a pleasant 73 y.o. male who presents today for repeat discussion of a left wrist volar ganglion cyst that is been present now for approximately 1 month.  He denies any significant pain in this region, has maintained good range of motion of the wrist without significant restriction.  Does have some slight numbness in the volar palmar aspect of the hand, denies any numbness in the fingers.  He underwent recent abdominal hernia surgery last month and is currently recovering from this quite well.  He would like to discuss timing regarding the volar ganglion cyst excision for later this year.  Pertinent ROS were reviewed with the patient and found to be negative unless otherwise specified above in HPI.     Assessment & Plan: Visit Diagnoses:  1. Ganglion cyst of volar aspect of left wrist       Plan: Extensive discussion was had with the patient today regarding his left wrist volar ganglion cyst.  We discussed the underlying pathophysiology of this condition as well as potential treatment options.  We discussed potential observation versus surgical excision.   We discussed the surgical approach and process for volar ganglion cyst excision at the wrist as well as the postoperative recovery.  Risks and benefits of the procedure were discussed, risks including but not limited to infection, bleeding, scarring, stiffness, nerve injury, tendon injury, vascular injury, recurrence of symptoms and need for subsequent operation.  Patient expressed understanding.    He would like to move forward with left wrist volar ganglion cyst excision.  Will move forward with surgical scheduling.    Follow-up: No follow-ups on file.   Meds & Orders: No orders of the  defined types were placed in this encounter.   No orders of the defined types were placed in this encounter.    Procedures: No procedures performed      Clinical History: No specialty comments available.  He reports that he has never smoked. He has never used smokeless tobacco. No results for input(s): HGBA1C, LABURIC in the last 8760 hours.  Objective:   Vital Signs: There were no vitals taken for this visit.  Physical Exam  Gen: Well-appearing, in no acute distress; non-toxic CV: Regular Rate. Well-perfused. Warm.  Resp: Breathing unlabored on room air; no wheezing. Psych: Fluid speech in conversation; appropriate affect; normal thought process  Ortho Exam Left wrist: - Notable volar mass, soft, compressible, mobile, transilluminates, measures approximately 2 cm x 2.5 cm, multilobulated - Palpable radial and ulnar pulses at the wrist, normal Allen's testing with refill of both radial and ulnar circulations at the hand - Mildly positive Tinel's over the cyst, sensation is intact to the distal aspect of the thumb index and long finger to light touch, slight area of numbness over the palmar volar aspect of the hand, likely in the distribution of the palmar cutaneous branch  Imaging: No results found.   Past Medical/Family/Surgical/Social History: Medications & Allergies reviewed per EMR, new medications updated. 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   Hypertension    Umbilical hernia    Family History   Problem Relation Age of Onset   Heart attack Mother    Heart attack Maternal Grandmother    Heart attack Maternal Grandfather    Stroke Paternal Grandmother    Stroke Paternal Grandfather    Heart attack Brother    Heart attack Sister    Past Surgical History:  Procedure Laterality Date   AORTIC ROOT REPLACEMENT  04/07/2007   aortic dissection repair and AVR   COLONOSCOPY     FOOT SURGERY Left 09/03/2005   HERNIA REPAIR     left in 03/1984; right in 03/1977   TOTAL HIP ARTHROPLASTY Right 08/25/2014   Procedure: TOTAL HIP ARTHROPLASTY;  Surgeon: Jerona Harden GAILS, MD;  Location: [facility] OR;  Service: Orthopedics;  Laterality: Right;  Right Total Hip Arthroplasty   VENTRAL HERNIA REPAIR N/A 01/08/2024   Procedure: LAPAROSCOPIC VENTRAL HERNIA REPAIR WITH MESH;  Surgeon: Rubin Calamity, MD;  Location: [facility] OR;  Service: General;  Laterality: N/A;   Social History   Occupational History   Not on file  Tobacco Use   Smoking status: Never   Smokeless tobacco: Never  Vaping Use   Vaping status: Never Used  Substance and Sexual Activity   Alcohol use: Yes    Alcohol/week: 3.0 standard drinks of alcohol    Types: 3 Glasses of wine per week   Drug use: No   Sexual activity: Not on file    Criston Chancellor Estela) Arlinda, M.D. [facility]

## 2024-06-11 ENCOUNTER — Ambulatory Visit: Payer: Self-pay | Admitting: General Surgery

## 2024-06-11 DIAGNOSIS — D171 Benign lipomatous neoplasm of skin and subcutaneous tissue of trunk: Secondary | ICD-10-CM | POA: Diagnosis not present

## 2024-06-11 NOTE — H&P (Signed)
 Subjective   Chief Complaint: Lipoma (Pt states that is has been there for years and he would like to get rid of it. )       History of Present Illness: Michael Moore is a 73 y.o. male who is seen today as an office consultation at the request of Dr. Husain for evaluation of Lipoma (Pt states that is has been there for years and he would like to get rid of it. ) .   History of Present Illness Michael Moore is a 73 year old male who presents for evaluation of a lipoma on his back. He was referred by Dr. Hussain for surgical evaluation of the lipoma.   The lipoma on his back has been present for a long time without infection, pain, or other symptoms. He is interested in surgical removal of the lipoma. He plans to have the surgery after his cardiology appointment in December.         Review of Systems: A complete review of systems was obtained from the patient.  I have reviewed this information and discussed as appropriate with the patient.  See HPI as well for other ROS.   Review of Systems  Constitutional:  Negative for fever.  HENT:  Negative for congestion.   Eyes:  Negative for blurred vision.  Respiratory:  Negative for cough, shortness of breath and wheezing.   Cardiovascular:  Negative for chest pain and palpitations.  Gastrointestinal:  Negative for heartburn.  Genitourinary:  Negative for dysuria.  Musculoskeletal:  Negative for myalgias.  Skin:  Negative for rash.  Neurological:  Negative for dizziness and headaches.  Psychiatric/Behavioral:  Negative for depression and suicidal ideas.   All other systems reviewed and are negative.       Medical History: Past Medical History Past Medical History: Diagnosis Date  Aneurysm ()    Heart valve disease    Hypertension         Problem List There is no problem list on file for this patient.     Past Surgical History Past Surgical History: Procedure Laterality Date  REPLACEMENT AORTIC VALVE   2008  Foot  surgery  Left    HERNIA REPAIR      JOINT REPLACEMENT       Hip      Allergies Allergies Allergen Reactions  Ace Inhibitors Cough  Azithromycin Other (See Comments)      Medications Ordered Prior to Encounter Current Outpatient Medications on File Prior to Visit Medication Sig Dispense Refill  aspirin  81 MG EC tablet 1 tablet Orally Once a day for 30 day(s)      hydroCHLOROthiazide  (HYDRODIURIL ) 25 MG tablet Take by mouth      losartan  (COZAAR ) 100 MG tablet Take 100 mg by mouth once daily      tadalafiL (CIALIS) 20 MG tablet Take 20 mg by mouth once daily as needed      metoprolol  succinate (TOPROL -XL) 100 MG XL tablet Take 100 mg by mouth once daily        No current facility-administered medications on file prior to visit.      Family History History reviewed. No pertinent family history.     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 : Never true Transportation Needs: No Transportation Needs (10/07/2023)   Received from [facility]    In the past 12 months, has lack of reliable transportation kept you from medical appointments, meetings, work or from getting things needed for daily living? : No Housing Stability: Unknown (11/19/2023)   Housing Stability Vital Sign    Homeless in the Last Year: No      Objective:      Vitals:   06/11/24 1355 PainSc: 0-No pain   There is no height or weight on file to calculate BMI.   Physical Exam Constitutional:      General: He is not in acute distress.    Appearance: Normal appearance.  HENT:     Head: Normocephalic.     Nose: No rhinorrhea.     Mouth/Throat:     Mouth: Mucous membranes are moist.     Pharynx: Oropharynx is clear.  Eyes:     General: No scleral icterus.    Pupils: Pupils are equal, round, and reactive to light.  Cardiovascular:     Rate and Rhythm: Normal rate.     Pulses: Normal pulses.  Pulmonary:     Effort: Pulmonary effort is normal. No respiratory distress.     Breath sounds: No stridor. No wheezing.  Abdominal:     General: Abdomen is flat. There is no distension.     Tenderness: There is no abdominal tenderness. There is no guarding or rebound.  Musculoskeletal:        General: Normal range of motion.     Cervical back: Normal range of motion and neck supple.  Skin:    General: Skin is warm and dry.     Capillary Refill: Capillary refill takes less than 2 seconds.     Coloration: Skin is not jaundiced.         Comments: 8x6cm SQ mass  Neurological:     General: No focal deficit present.     Mental Status: He is alert and oriented to person, place, and time. Mental status is at baseline.  Psychiatric:        Mood and Affect: Mood normal.        Thought Content: Thought content normal.        Judgment: Judgment normal.         Assessment and Plan: Diagnoses and all orders for this visit:   Lipoma of back     Michael Moore is a 73 y.o. male    Assessment & Plan Lipoma of back Lipoma located on the back, coinciding with the natural curvature of the spine, contributing to bulk. Lipoma is the primary consideration. - Schedule surgery for excision of lipoma after December 7th cardiology appointment and echocardiogram. - Perform outpatient excision with anesthesia, covering incision with waterproof dressing. -  Discuss potential complications: infection, bleeding, bruising, recurrence.                 No follow-ups on file.   Lynda Leos, MD, [facility], [place] [facility]

## 2024-08-07 ENCOUNTER — Encounter (HOSPITAL_BASED_OUTPATIENT_CLINIC_OR_DEPARTMENT_OTHER)
Admission: RE | Admit: 2024-08-07 | Discharge: 2024-08-07 | Disposition: A | Discharge status: Home / Self Care | Source: Ambulatory Visit | Attending: Orthopedic Surgery | Admitting: Orthopedic Surgery

## 2024-08-07 ENCOUNTER — Other Ambulatory Visit: Payer: Self-pay

## 2024-08-07 ENCOUNTER — Encounter (HOSPITAL_BASED_OUTPATIENT_CLINIC_OR_DEPARTMENT_OTHER): Payer: Self-pay | Admitting: Orthopedic Surgery

## 2024-08-07 DIAGNOSIS — Z01818 Encounter for other preprocedural examination: Secondary | ICD-10-CM | POA: Diagnosis not present

## 2024-08-07 DIAGNOSIS — Z01810 Encounter for preprocedural cardiovascular examination: Secondary | ICD-10-CM | POA: Diagnosis present

## 2024-08-07 DIAGNOSIS — Z01812 Encounter for preprocedural laboratory examination: Secondary | ICD-10-CM | POA: Diagnosis present

## 2024-08-07 LAB — BASIC METABOLIC PANEL WITH GFR
Anion gap: 10 (ref 5–15)
BUN: 17 mg/dL (ref 8–23)
CO2: 27 mmol/L (ref 22–32)
Calcium: 9.1 mg/dL (ref 8.9–10.3)
Chloride: 102 mmol/L (ref 98–111)
Creatinine, Ser: 1.17 mg/dL (ref 0.61–1.24)
GFR, Estimated: >60 mL/min (ref >60)
Glucose, Bld: 104 mg/dL — ABNORMAL HIGH (ref 70–99)
Potassium: 4.3 mmol/L (ref 3.5–5.1)
Sodium: 139 mmol/L (ref 135–145)

## 2024-08-10 ENCOUNTER — Other Ambulatory Visit: Payer: Self-pay

## 2024-08-10 DIAGNOSIS — M67432 Ganglion, left wrist: Secondary | ICD-10-CM

## 2024-08-14 ENCOUNTER — Encounter (HOSPITAL_BASED_OUTPATIENT_CLINIC_OR_DEPARTMENT_OTHER): Payer: Self-pay | Admitting: Orthopedic Surgery

## 2024-08-14 ENCOUNTER — Ambulatory Visit (HOSPITAL_BASED_OUTPATIENT_CLINIC_OR_DEPARTMENT_OTHER): Admitting: Anesthesiology

## 2024-08-14 ENCOUNTER — Encounter (HOSPITAL_BASED_OUTPATIENT_CLINIC_OR_DEPARTMENT_OTHER)
Admission: RE | Disposition: A | Discharge status: Home / Self Care | Payer: Self-pay | Source: Home / Self Care | Attending: Orthopedic Surgery

## 2024-08-14 ENCOUNTER — Ambulatory Visit (HOSPITAL_BASED_OUTPATIENT_CLINIC_OR_DEPARTMENT_OTHER)
Admission: RE | Admit: 2024-08-14 | Discharge: 2024-08-14 | Disposition: A | Discharge status: Home / Self Care | Attending: Orthopedic Surgery | Admitting: Orthopedic Surgery

## 2024-08-14 ENCOUNTER — Other Ambulatory Visit: Payer: Self-pay

## 2024-08-14 DIAGNOSIS — M67432 Ganglion, left wrist: Secondary | ICD-10-CM

## 2024-08-14 DIAGNOSIS — I10 Essential (primary) hypertension: Secondary | ICD-10-CM | POA: Insufficient documentation

## 2024-08-14 DIAGNOSIS — Z01818 Encounter for other preprocedural examination: Secondary | ICD-10-CM

## 2024-08-14 HISTORY — PX: GANGLION CYST EXCISION: SHX1691

## 2024-08-14 LAB — NO BLOOD PRODUCTS

## 2024-08-14 SURGERY — EXCISION, GANGLION CYST, WRIST
Anesthesia: General | Site: Wrist | Laterality: Left

## 2024-08-14 MED ORDER — MIDAZOLAM HCL 2 MG/2ML IJ SOLN
INTRAMUSCULAR | Status: AC
  Filled 2024-08-14: qty 2

## 2024-08-14 MED ORDER — LACTATED RINGERS IV SOLN: INTRAVENOUS | Status: DC

## 2024-08-14 MED ORDER — CEFAZOLIN SODIUM-DEXTROSE 2-4 GM/100ML-% IV SOLN
INTRAVENOUS | Status: AC
  Filled 2024-08-14: qty 100

## 2024-08-14 MED ORDER — EPHEDRINE 5 MG/ML INJ
INTRAVENOUS | Status: AC
  Filled 2024-08-14: qty 5

## 2024-08-14 MED ORDER — ONDANSETRON HCL 4 MG/2ML IJ SOLN
INTRAMUSCULAR | Status: DC | PRN
  Administered 2024-08-14: 4 mg via INTRAVENOUS

## 2024-08-14 MED ORDER — CHLORHEXIDINE GLUCONATE CLOTH 2 % EX PADS: 6.0000 | MEDICATED_PAD | Freq: Once | CUTANEOUS | Status: DC

## 2024-08-14 MED ORDER — ROPIVACAINE HCL 5 MG/ML IJ SOLN
INTRAMUSCULAR | Status: DC | PRN
  Administered 2024-08-14: 10 mL

## 2024-08-14 MED ORDER — FENTANYL CITRATE (PF) 100 MCG/2ML IJ SOLN: 25.0000 ug | INTRAMUSCULAR | Status: DC | PRN

## 2024-08-14 MED ORDER — FENTANYL CITRATE (PF) 100 MCG/2ML IJ SOLN
INTRAMUSCULAR | Status: AC
  Filled 2024-08-14: qty 2

## 2024-08-14 MED ORDER — ENSURE PRE-SURGERY PO LIQD: 296.0000 mL | Freq: Once | ORAL | Status: DC

## 2024-08-14 MED ORDER — EPHEDRINE SULFATE (PRESSORS) 50 MG/ML IJ SOLN
INTRAMUSCULAR | Status: DC | PRN
  Administered 2024-08-14 (×3): 10 mg via INTRAVENOUS

## 2024-08-14 MED ORDER — 0.9 % SODIUM CHLORIDE (POUR BTL) OPTIME
TOPICAL | Status: DC | PRN
  Administered 2024-08-14: 300 mL

## 2024-08-14 MED ORDER — LIDOCAINE HCL (CARDIAC) PF 100 MG/5ML IV SOSY
PREFILLED_SYRINGE | INTRAVENOUS | Status: DC | PRN
  Administered 2024-08-14: 60 mg via INTRAVENOUS

## 2024-08-14 MED ORDER — PHENYLEPHRINE 80 MCG/ML (10ML) SYRINGE FOR IV PUSH (FOR BLOOD PRESSURE SUPPORT)
PREFILLED_SYRINGE | INTRAVENOUS | Status: AC
  Filled 2024-08-14: qty 10

## 2024-08-14 MED ORDER — ACETAMINOPHEN 500 MG PO TABS
ORAL_TABLET | ORAL | Status: AC
  Filled 2024-08-14: qty 2

## 2024-08-14 MED ORDER — DROPERIDOL 2.5 MG/ML IJ SOLN: 0.6250 mg | Freq: Once | INTRAMUSCULAR | Status: DC | PRN

## 2024-08-14 MED ORDER — OXYCODONE HCL 5 MG PO TABS: 5.0000 mg | ORAL_TABLET | Freq: Once | ORAL | Status: DC | PRN

## 2024-08-14 MED ORDER — ACETAMINOPHEN 500 MG PO TABS
1000.0000 mg | ORAL_TABLET | ORAL | Status: AC
  Administered 2024-08-14: 1000 mg via ORAL

## 2024-08-14 MED ORDER — CEFAZOLIN SODIUM-DEXTROSE 2-4 GM/100ML-% IV SOLN: 2.0000 g | INTRAVENOUS | Status: DC

## 2024-08-14 MED ORDER — PROPOFOL 10 MG/ML IV BOLUS
INTRAVENOUS | Status: DC | PRN
  Administered 2024-08-14: 200 mg via INTRAVENOUS

## 2024-08-14 MED ORDER — OXYCODONE HCL 5 MG PO TABS: 5.0000 mg | ORAL_TABLET | Freq: Four times a day (QID) | ORAL | 0 refills | Status: DC | PRN

## 2024-08-14 MED ORDER — CEFAZOLIN SODIUM-DEXTROSE 2-4 GM/100ML-% IV SOLN
2.0000 g | INTRAVENOUS | Status: AC
  Administered 2024-08-14: 2 g via INTRAVENOUS

## 2024-08-14 MED ORDER — OXYCODONE HCL 5 MG/5ML PO SOLN: 5.0000 mg | Freq: Once | ORAL | Status: DC | PRN

## 2024-08-14 MED ORDER — DEXAMETHASONE SOD PHOSPHATE PF 10 MG/ML IJ SOLN
INTRAMUSCULAR | Status: DC | PRN
  Administered 2024-08-14: 5 mg via INTRAVENOUS

## 2024-08-14 MED ORDER — FENTANYL CITRATE (PF) 100 MCG/2ML IJ SOLN
INTRAMUSCULAR | Status: DC | PRN
  Administered 2024-08-14 (×2): 50 ug via INTRAVENOUS

## 2024-08-14 MED ORDER — MIDAZOLAM HCL 5 MG/5ML IJ SOLN
INTRAMUSCULAR | Status: DC | PRN
  Administered 2024-08-14: 1 mg via INTRAVENOUS

## 2024-08-14 MED FILL — Ropivacaine HCl Inj 5 MG/ML: INTRAMUSCULAR | Qty: 30 | Status: AC

## 2024-08-14 MED FILL — Succinylcholine Chloride Sol Pref Syr 200 MG/10ML (20 MG/ML): INTRAVENOUS | Qty: 10 | Status: AC

## 2024-08-14 MED FILL — Ondansetron HCl Inj 4 MG/2ML (2 MG/ML): INTRAMUSCULAR | Qty: 2 | Status: AC

## 2024-08-14 MED FILL — Atropine Sulfate IV Soln 0.4 MG/ML: INTRAVENOUS | Qty: 1 | Status: AC

## 2024-08-14 MED FILL — Lidocaine HCl Local Soln Prefilled Syringe 100 MG/5ML (2%): INTRAMUSCULAR | Qty: 5 | Status: AC

## 2024-08-14 SURGICAL SUPPLY — 39 items
BLADE SURG 15 STRL LF DISP TIS (BLADE) ×2 IMPLANT
BNDG COHESIVE 4X5 TAN STRL LF (GAUZE/BANDAGES/DRESSINGS) ×1 IMPLANT
BNDG COMPR ESMARK 4X3 LF (GAUZE/BANDAGES/DRESSINGS) ×1 IMPLANT
BNDG ELASTIC 4INX 5YD STR LF (GAUZE/BANDAGES/DRESSINGS) ×1 IMPLANT
BNDG GAUZE DERMACEA FLUFF 4 (GAUZE/BANDAGES/DRESSINGS) ×1 IMPLANT
CHLORAPREP W/TINT 26 (MISCELLANEOUS) ×2 IMPLANT
CORD BIPOLAR FORCEPS 12FT (ELECTRODE) ×1 IMPLANT
COVER BACK TABLE 60X90IN (DRAPES) ×1 IMPLANT
CUFF TOURN SGL QUICK 18X4 (TOURNIQUET CUFF) IMPLANT
DERMABOND ADVANCED .7 DNX12 (GAUZE/BANDAGES/DRESSINGS) IMPLANT
DRAPE HAND 77X146 (DRAPES) ×1 IMPLANT
DRAPE SURG 17X23 STRL (DRAPES) ×1 IMPLANT
GAUZE SPONGE 4X4 12PLY STRL (GAUZE/BANDAGES/DRESSINGS) ×1 IMPLANT
GAUZE STRETCH 2X75IN STRL (MISCELLANEOUS) ×1 IMPLANT
GAUZE XEROFORM 1X8 LF (GAUZE/BANDAGES/DRESSINGS) IMPLANT
GLOVE BIO SURGEON STRL SZ7.5 (GLOVE) ×1 IMPLANT
GLOVE BIOGEL PI IND STRL 7.5 (GLOVE) ×1 IMPLANT
GOWN STRL REUS W/ TWL LRG LVL3 (GOWN DISPOSABLE) ×2 IMPLANT
GOWN STRL REUS W/TWL XL LVL3 (GOWN DISPOSABLE) IMPLANT
GOWN STRL SURGICAL XL XLNG (GOWN DISPOSABLE) ×1 IMPLANT
NDL HYPO 25X5/8 SAFETYGLIDE (NEEDLE) IMPLANT
NEEDLE HYPO 25X5/8 SAFETYGLIDE (NEEDLE) ×1 IMPLANT
NS IRRIG 1000ML POUR BTL (IV SOLUTION) IMPLANT
PACK BASIN DAY SURGERY FS (CUSTOM PROCEDURE TRAY) ×1 IMPLANT
PADDING CAST ABS COTTON 4X4 ST (CAST SUPPLIES) ×1 IMPLANT
SHEET MEDIUM DRAPE 40X70 STRL (DRAPES) ×1 IMPLANT
SPIKE FLUID TRANSFER (MISCELLANEOUS) IMPLANT
SPLINT PLASTER CAST XFAST 4X15 (CAST SUPPLIES) ×1 IMPLANT
STOCKINETTE IMPERVIOUS 9X36 MD (GAUZE/BANDAGES/DRESSINGS) ×1 IMPLANT
STRIP CLOSURE SKIN 1/2X4 (GAUZE/BANDAGES/DRESSINGS) IMPLANT
SUCTION TUBE FRAZIER 10FR DISP (SUCTIONS) IMPLANT
SUT ETHILON 4 0 PS 2 18 (SUTURE) IMPLANT
SUT MNCRL AB 3-0 PS2 18 (SUTURE) IMPLANT
SUT MNCRL AB 4-0 PS2 18 (SUTURE) IMPLANT
SUT VIC AB 3-0 PS2 18XBRD (SUTURE) IMPLANT
SYR BULB EAR ULCER 3OZ GRN STR (SYRINGE) ×2 IMPLANT
SYR CONTROL 10ML LL (SYRINGE) IMPLANT
TOWEL GREEN STERILE FF (TOWEL DISPOSABLE) ×2 IMPLANT
TUBE CONNECTING 20X1/4 (TUBING) IMPLANT

## 2024-08-14 NOTE — Discharge Instructions (Addendum)
    Hand Surgery Postop Instructions   Dressings: Maintain postoperative dressing until orthopedic follow-up.  Keep operative site clean and dry until orthopedic follow-up.  Wound Care: Keep your hand elevated above the level of your heart.  Do not allow it to dangle by your side. Moving your fingers is advised to stimulate circulation but will depend on the site of your surgery.  If you have a splint applied, your doctor will advise you regarding movement.  Activity: Do not drive or operate machinery until clearance given from physician. No heavy lifting with operative extremity.  Diet:  Drink liquids today or eat a light diet.  You may resume a regular diet tomorrow.    General expectations: Take prescribed medication if given, transition to over-the-counter medication as quickly as possible. Fingers may become slightly swollen.  Call your doctor if any of the following occur: Severe pain not relieved by pain medication. Elevated temperature. Dressing soaked with blood. Inability to move fingers. White or bluish color to fingers.   Per [facility] clinic policy, our goal is ensure optimal postoperative pain control with a multimodal pain management strategy. For all [facility] patients, our goal is to wean post-operative narcotic medications by 6 weeks post-operatively. If this is not possible due to utilization of pain medication prior to surgery, your [facility] doctor will support your acute post-operative pain control for the first 6 weeks postoperatively, with a plan to transition you back to your primary pain team following that. Maralee will work to ensure a Therapist, occupational.  Anshul Afton Alderton, M.D. Hand Surgery [facility]    Post Anesthesia Home Care Instructions  Activity: Get plenty of rest for the remainder of the day. A responsible individual must stay with you for 24 hours following the procedure.  For the next 24 hours, DO NOT: -Drive a  car -Advertising copywriter -Drink alcoholic beverages -Take any medication unless instructed by your physician -Make any legal decisions or sign important papers.  Meals: Start with liquid foods such as gelatin or soup. Progress to regular foods as tolerated. Avoid greasy, spicy, heavy foods. If nausea and/or vomiting occur, drink only clear liquids until the nausea and/or vomiting subsides. Call your physician if vomiting continues.  Special Instructions/Symptoms: Your throat may feel dry or sore from the anesthesia or the breathing tube placed in your throat during surgery. If this causes discomfort, gargle with warm salt water. The discomfort should disappear within 24 hours.  If you had a scopolamine patch placed behind your ear for the management of post- operative nausea and/or vomiting:  1. The medication in the patch is effective for 72 hours, after which it should be removed.  Wrap patch in a tissue and discard in the trash. Wash hands thoroughly with soap and water. 2. You may remove the patch earlier than 72 hours if you experience unpleasant side effects which may include dry mouth, dizziness or visual disturbances. 3. Avoid touching the patch. Wash your hands with soap and water after contact with the patch.    Next dose of tylenol  if needed will be at 12:38pm

## 2024-08-14 NOTE — Op Note (Signed)
 NAME: Michael Moore MEDICAL RECORD NO: [id number] DATE OF BIRTH: 11/25/1950 FACILITY: [facility] LOCATION: [facility] PHYSICIAN: GILDARDO ALDERTON, MD   OPERATIVE REPORT   DATE OF PROCEDURE: 08/14/24    PREOPERATIVE DIAGNOSIS: Left wrist volar ganglion cyst   POSTOPERATIVE DIAGNOSIS: Left wrist volar ganglion cyst   PROCEDURE: Left wrist volar ganglion cyst excision Left wrist palmar cutaneous branch of median nerve neurolysis    SURGEON:  GILDARDO ALDERTON, M.D.   ASSISTANT: Joesph Hooks, OPA   ANESTHESIA:  General   INTRAVENOUS FLUIDS:  Per anesthesia flow sheet.   ESTIMATED BLOOD LOSS:  Minimal.   COMPLICATIONS:  None.   SPECIMENS:  volar cyst removed and sent for pathology   TOURNIQUET TIME:    Total Tourniquet Time Documented: Upper Arm (Left) - 14 minutes Total: Upper Arm (Left) - 14 minutes    DISPOSITION:  Stable to PACU.   INDICATIONS: 73 year old male with right wrist volar ganglion cyst that had been present for multiple months. Risks and benefits of surgery were discussed including the risks of infection, bleeding, scarring, stiffness, nerve injury, vascular injury, tendon injury, need for subsequent operation, , recurrence.  Patient was also describing some numbness to the palmar sensory cutaneous nerve distribution.  He voiced understanding of these risks and elected to proceed.  OPERATIVE COURSE: Patient was seen and identified in the preoperative area and marked appropriately.  Surgical consent had been signed. Preoperative IV antibiotic prophylaxis was given. He was transferred to the operating room and placed in supine position with the Left upper extremity on an arm board.  General anesthesia was induced by the anesthesiologist.  Left upper extremity was prepped and draped in normal sterile orthopedic fashion.  A surgical pause was performed between the surgeons, anesthesia, and operating room staff and all were in agreement as to the patient,  procedure, and site of procedure.  Tourniquet was placed and padded appropriately to the left upper arm.  The arm was exsanguinated and tourniquet inflated to 250 mmHg. Longitudinal incision was designed over the volar radial aspect of the wrist, proximal to the wrist crease.  Incision was carried down utilizing a 15 blade.  Blunt dissection was performed, taking care to protect underlying neurovascular structures.  Radial artery was identified and protected in its entirety.  Cyst was carefully dissected and noted to be lobulated around the palmar cutaneous branch of median nerve, cyst carefully peeled off the neurovascular structures.  Stalk was then identified and traced down to the level of the radiocarpal joint.  Both cyst and its stalk were removed in its entirety and sent for surgical specimen.  Given the involvement with the palmar cutaneous branch of median nerve and preoperative examination with numbness in this distribution, extensive neurolysis was performed.     The tourniquet was deflated at 14 minutes.  Fingertips were pink with brisk capillary refill after deflation of tourniquet.  Both radial and ulnar collateral circulation was noted to be appropriate at the wrist level.  Copious irrigation was performed.  Layered closure was performed using combination of 3-0 monocryl for the subcutaneous layer and 4-0 monocryl for the skin surface.  Dermabond was applied.  Sterile dressings were placed followed by application of a volar slab splint utilizing plaster.    Fingertips were pink with brisk capillary refill after deflation of tourniquet.  The operative drapes were broken down.  The patient was awoken from anesthesia safely and taken to PACU in stable condition.   Post-operative plan: The patient will  recover in the post-anesthesia care unit and then be discharged home.  The patient will be non weight bearing on the left upper extremity in volar slab wrist splint.   I will see the patient back in  the office in 2 weeks for postoperative followup.    Alinda Egolf, MD Electronically signed, 08/14/24

## 2024-08-14 NOTE — H&P (Signed)
 @LOGODEPT @  Michael Moore - 73 y.o. male MRN [id number]  Date of birth: February 13, 1951   HAND SURGERY H&P UPDATE   HPI: Patient is a 73 y.o. male who presents with left wrist volar ganglion cyst.  Patient denies any changes to their medical history or new systemic symptoms today.    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   Hypertension    Umbilical hernia    Past Surgical History:  Procedure Laterality Date   AORTIC ROOT REPLACEMENT  04/07/2007   aortic dissection repair and AVR   COLONOSCOPY     FOOT SURGERY Left 09/03/2005   HERNIA REPAIR     left in 03/1984; right in 03/1977   TOTAL HIP ARTHROPLASTY Right 08/25/2014   Procedure: TOTAL HIP ARTHROPLASTY;  Surgeon: Jerona Harden GAILS, MD;  Location: [facility] OR;  Service: Orthopedics;  Laterality: Right;  Right Total Hip Arthroplasty   VENTRAL HERNIA REPAIR N/A 01/08/2024   Procedure: LAPAROSCOPIC VENTRAL HERNIA REPAIR WITH MESH;  Surgeon: Rubin Calamity, MD;  Location: [facility] OR;  Service: General;  Laterality: N/A;   Social History   Socioeconomic History   Marital status: Married    Spouse name: Not on file   Number of children: 1   Years of education: Not on file   Highest education level: Not on file  Occupational History   Not on file  Tobacco Use   Smoking status: Never   Smokeless tobacco: Never  Vaping Use   Vaping status: Never Used  Substance and Sexual Activity   Alcohol use: Yes    Alcohol/week: 3.0 standard drinks of alcohol    Types: 3 Glasses of wine per week   Drug use: No   Sexual activity: Not on file  Other Topics Concern   Not on file  Social History Narrative   Not on file   Social Drivers of Health   Financial Resource Strain: Not on file  Food Insecurity: Low  Risk  (10/07/2023)   Received from [facility]   Hunger Vital Sign    Within the past 12 months, you worried that your food would run out before you got money to buy more: Never true    Within the past 12 months, the food you bought just didn't last and you didn't have money to get more. : Never true  Transportation Needs: No Transportation Needs (10/07/2023)   Received from [facility]    In the past 12 months, has lack of reliable transportation kept you from medical appointments, meetings, work or from getting things needed for daily living? : No  Physical Activity: Not on file  Stress: Not on file  Social Connections: Not on file   Family History  Problem Relation Age of Onset   Heart attack Mother    Heart attack Maternal Grandmother    Heart attack Maternal Grandfather    Stroke Paternal Grandmother    Stroke Paternal Grandfather    Heart attack Brother    Heart attack Sister    - negative except otherwise stated in the family history section No Known Allergies Prior to Admission medications   Medication Sig Start Date End Date Taking? Authorizing  Provider  aspirin  EC 81 MG tablet Take 81 mg by mouth daily. Swallow whole.   Yes [provider]  calcium citrate-vitamin D (CITRACAL+D) 315-200 MG-UNIT per tablet Take 1 tablet by mouth daily.   Yes [provider]  hydrochlorothiazide  (HYDRODIURIL ) 25 MG tablet Take 25 mg by mouth every morning.   Yes [provider]  losartan  (COZAAR ) 100 MG tablet Take 100 mg by mouth daily.   Yes [provider]  metoprolol  succinate (TOPROL -XL) 100 MG 24 hr tablet Take 100 mg by mouth daily. 03/23/14  Yes [provider]  Multiple Vitamins-Minerals (MULTIVITAMIN PO) Take 1 tablet by mouth daily.    [provider]  tadalafil (CIALIS) 20 MG tablet Take 20 mg by mouth daily as needed for erectile dysfunction. 05/30/23   [provider]  traMADol  (ULTRAM ) 50 MG tablet  Take 1 tablet (50 mg total) by mouth every 6 (six) hours as needed. 01/08/24 01/07/25  Rubin Calamity, MD  vitamin E 180 MG (400 UNITS) capsule Take 400 Units by mouth daily.    [provider]   No results found. - Positive ROS: All other systems have been reviewed and were otherwise negative with the exception of those mentioned in the HPI and as above.  Physical Exam: General: No acute distress, resting comfortably Cardiovascular: BUE warm and well perfused, normal rate Respiratory: Normal WOB on RA Skin: Warm and dry Neurologic: Sensation intact distally Psychiatric: Patient is at baseline mood and affect  Left Upper Extremity  Left wrist: - Notable volar mass, soft, compressible, mobile, transilluminates, measures approximately 2 cm x 2.5 cm, multilobulated - Palpable radial and ulnar pulses at the wrist, normal Allen's testing with refill of both radial and ulnar circulations at the hand - Mildly positive Tinel's over the cyst, sensation is intact to the distal aspect of the thumb index and long finger to light touch, slight area of numbness over the palmar volar aspect of the hand, likely in the distribution of the palmar cutaneous branch  Assessment/Plan: OR today for left wrist volar ganglion cyst excision. We again reviewed the risks of surgery which include bleeding, infection, damage to neurovascular structures, persistent symptoms, need for additional surgery.  Informed consent was signed.  All questions were answered.   [facility], Hand Surgery

## 2024-08-14 NOTE — Anesthesia Procedure Notes (Signed)
 Procedure Name: LMA Insertion Date/Time: 08/14/2024 7:48 AM  Performed by: Emilio Rock BIRCH, CRNAPre-anesthesia Checklist: Patient identified, Emergency Drugs available, Suction available and Patient being monitored Patient Re-evaluated:Patient Re-evaluated prior to induction Oxygen Delivery Method: Circle System Utilized Preoxygenation: Pre-oxygenation with 100% oxygen Induction Type: IV induction Ventilation: Mask ventilation without difficulty LMA: LMA inserted LMA Size: 5.0 Number of attempts: 1 Airway Equipment and Method: bite block Placement Confirmation: positive ETCO2 Tube secured with: Tape Dental Injury: Teeth and Oropharynx as per pre-operative assessment

## 2024-08-14 NOTE — Transfer of Care (Signed)
 Immediate Anesthesia Transfer of Care Note  Patient: Michael Moore  Procedure(s) Performed: EXCISION, GANGLION CYST, WRIST (Left: Wrist)  Patient Location: PACU  Anesthesia Type:General  Level of Consciousness: awake, alert , oriented, drowsy, and patient cooperative  Airway & Oxygen Therapy: Patient Spontanous Breathing and Patient connected to face mask oxygen  Post-op Assessment: Report given to RN and Post -op Vital signs reviewed and stable  Post vital signs: Reviewed and stable  Last Vitals:  Vitals Value Taken Time  BP    Temp    Pulse 55 08/14/24 08:39  Resp    SpO2 96 % 08/14/24 08:39  Vitals shown include unfiled device data.  Last Pain:  Vitals:   08/14/24 0637  TempSrc: Tympanic  PainSc: 0-No pain      Patients Stated Pain Goal: 7 (08/14/24 9362)  Complications: No notable events documented.

## 2024-08-14 NOTE — Anesthesia Postprocedure Evaluation (Signed)
 Anesthesia Post Note  Patient: Michael Moore  Procedure(s) Performed: EXCISION, GANGLION CYST, WRIST (Left: Wrist)     Patient location during evaluation: PACU Anesthesia Type: General Level of consciousness: awake and alert Pain management: pain level controlled Vital Signs Assessment: post-procedure vital signs reviewed and stable Respiratory status: spontaneous breathing, nonlabored ventilation, respiratory function stable and patient connected to nasal cannula oxygen Cardiovascular status: blood pressure returned to baseline and stable Postop Assessment: no apparent nausea or vomiting Anesthetic complications: no   No notable events documented.  Last Vitals:  Vitals:   08/14/24 0845 08/14/24 0928  BP: 135/73 (!) 123/59  Pulse: (!) 54 (!) 47  Resp: 14 20  Temp:  (!) 36.2 C  SpO2: 94% 92%    Last Pain:  Vitals:   08/14/24 0928  TempSrc:   PainSc: 0-No pain                 Cordella SQUIBB [place] Schorr

## 2024-08-14 NOTE — Anesthesia Preprocedure Evaluation (Addendum)
 Anesthesia Evaluation  Patient identified by MRN, date of birth, ID band Patient awake    Reviewed: Allergy & Precautions, NPO status , Patient's Chart, lab work & pertinent test results  Airway Mallampati: II  TM Distance: >3 FB Neck ROM: Full    Dental no notable dental hx.    Pulmonary neg pulmonary ROS   Pulmonary exam normal        Cardiovascular hypertension, + dysrhythmias  Rhythm:Regular Rate:Normal     Neuro/Psych negative neurological ROS  negative psych ROS   GI/Hepatic negative GI ROS, Neg liver ROS,,,  Endo/Other  negative endocrine ROS    Renal/GU negative Renal ROS  negative genitourinary   Musculoskeletal  (+) Arthritis , Osteoarthritis,    Abdominal Normal abdominal exam  (+)   Peds  Hematology negative hematology ROS (+)   Anesthesia Other Findings   Reproductive/Obstetrics                              Anesthesia Physical Anesthesia Plan  ASA: 2  Anesthesia Plan: General   Post-op Pain Management: Tylenol  PO (pre-op)*   Induction: Intravenous  PONV Risk Score and Plan: 1 and Ondansetron , Dexamethasone  and Treatment may vary due to age or medical condition  Airway Management Planned: Mask and LMA  Additional Equipment: None  Intra-op Plan:   Post-operative Plan: Extubation in OR  Informed Consent: I have reviewed the patients History and Physical, chart, labs and discussed the procedure including the risks, benefits and alternatives for the proposed anesthesia with the patient or authorized representative who has indicated his/her understanding and acceptance.     Dental advisory given  Plan Discussed with: CRNA  Anesthesia Plan Comments:          Anesthesia Quick Evaluation

## 2024-08-15 ENCOUNTER — Encounter (HOSPITAL_BASED_OUTPATIENT_CLINIC_OR_DEPARTMENT_OTHER): Payer: Self-pay | Admitting: Orthopedic Surgery

## 2024-08-17 LAB — SURGICAL PATHOLOGY

## 2024-08-24 ENCOUNTER — Ambulatory Visit: Admitting: Orthopedic Surgery

## 2024-08-24 DIAGNOSIS — Z9889 Other specified postprocedural states: Secondary | ICD-10-CM

## 2024-08-24 NOTE — Progress Notes (Unsigned)
   ELZIA HOTT - 73 y.o. male MRN [id number]  Date of birth: 1951/03/21  Office Visit Note: Visit Date: 08/24/2024 PCP: Ransom Other, MD Referred by: Ransom Other, MD  Subjective:  HPI: ZHION PEVEHOUSE is a 73 y.o. male who presents today for follow up 10 days status post left wrist volar ganglion cyst excision and palmar cutaneous branch of median nerve neurolysis.  Pertinent ROS were reviewed with the patient and found to be negative unless otherwise specified above in HPI.   Assessment & Plan: Visit Diagnoses: No diagnosis found.  Plan: ***  Follow-up: No follow-ups on file.   Meds & Orders: No orders of the defined types were placed in this encounter.  No orders of the defined types were placed in this encounter.    Procedures: No procedures performed       Objective:   Vital Signs: There were no vitals taken for this visit.  Ortho Exam ***  Imaging: No results found.   Anshul Afton Alderton, M.D. [facility], Hand Surgery

## 2024-08-25 NOTE — Therapy (Signed)
 OUTPATIENT OCCUPATIONAL THERAPY ORTHO EVALUATION  Patient Name: Michael Moore MRN: [id number] DOB:1951-01-30, 73 y.o., male Today's Date: 08/26/2024  PCP: Ransom BEAL MD REFERRING PROVIDER: Arlinda Buster, MD   END OF SESSION:  OT End of Session - 08/26/24 1305     Visit Number 1    Number of Visits 1    Authorization Type Medicare    OT Start Time 1305    OT Stop Time 1323    OT Time Calculation (min) 18 min    Activity Tolerance No increased pain;Patient tolerated treatment well    Behavior During Therapy [facility] for tasks assessed/performed          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   Hypertension    Umbilical hernia    Past Surgical History:  Procedure Laterality Date   AORTIC ROOT REPLACEMENT  04/07/2007   aortic dissection repair and AVR   COLONOSCOPY     FOOT SURGERY Left 09/03/2005   GANGLION CYST EXCISION Left 08/14/2024   Procedure: EXCISION, GANGLION CYST, WRIST;  Surgeon: Arlinda Buster, MD;  Location: [facility];  Service: Orthopedics;  Laterality: Left;   HERNIA REPAIR     left in 03/1984; right in 03/1977   TOTAL HIP ARTHROPLASTY Right 08/25/2014   Procedure: TOTAL HIP ARTHROPLASTY;  Surgeon: Jerona Harden GAILS, MD;  Location: [facility] OR;  Service: Orthopedics;  Laterality: Right;  Right Total Hip Arthroplasty   VENTRAL HERNIA REPAIR N/A 01/08/2024   Procedure: LAPAROSCOPIC VENTRAL HERNIA REPAIR WITH MESH;  Surgeon: Rubin Calamity, MD;  Location: [facility] OR;  Service: General;  Laterality: N/A;   Patient Active Problem List   Diagnosis Date Noted   Ganglion cyst of volar aspect of left wrist 08/14/2024   S/P total hip arthroplasty 08/25/2014   Essential hypertension 04/13/2014   History of aortic  valve replacement 04/13/2014   Status post left foot surgery 04/13/2014    ONSET DATE: DOS 08/14/24  REFERRING DIAG: F32.567 (ICD-10-CM) - Ganglion cyst of volar aspect of left wrist   THERAPY DIAG:  Muscle weakness (generalized)  Stiffness of left wrist, not elsewhere classified  Rationale for Evaluation and Treatment: Rehabilitation  SUBJECTIVE:   SUBJECTIVE STATEMENT: He is now ~2 weeks s/p volar ganglion cyst removal. He states having a cyst for several years that pinched and bothered him and also created some numbness near his thumb.  He states now that after surgeries over he has no more pinching and good sensation in his thumb.  He has been wearing a prefabricated wrist cock up and avoiding strong pushing or pulling.SABRA  [place]  He states he would just like 1 visit today for a consultation for how to recover safely     PERTINENT HISTORY: None pertinent to this issue  PRECAUTIONS: None  RED FLAGS: None   WEIGHT BEARING RESTRICTIONS: Yes recommended to keep weight between 5 and 10 pounds now for the next 2 weeks  PAIN:  Are you having pain? No  FALLS: Has patient fallen in last 6 months? No  PLOF: Independent  PATIENT GOALS: To learn best techniques to help safely recover from surgery  NEXT MD VISIT: Approximately 4 weeks  OBJECTIVE: (All objective assessments below are from initial evaluation on: 08/26/24 unless otherwise specified.)    HAND DOMINANCE: Right   ADLs: Overall ADLs: States decreased ability to grab, hold household objects, pain and difficulty to open containers, perform FMS tasks (manipulate fasteners on clothing).     FUNCTIONAL OUTCOME MEASURES: Eval: PSFS: 10    UPPER EXTREMITY ROM     Shoulder to Wrist AROM Left eval  Wrist flexion 64  Wrist extension 51  (Blank rows = not tested)   Hand AROM Left eval  Full Fist Ability (or Gap to Distal Palmar Crease) Full fist  Thumb Opposition  (Kapandji Scale)  WFL   (Blank rows = not tested)   UPPER EXTREMITY MMT:    Eval:  NT at eval due to recent and still healing injuries. Expected to improve with HEP and recommendations.   HAND FUNCTION: Eval: Observed weakness in affected Ltr hand/arm, but specific gripping and resistance training contraindicated today. Expected to improve with HEP and recommendations.    COORDINATION: Eval: No significant coordination deficits noted or verbalized  SENSATION: Eval:  Light touch intact today  EDEMA:   Eval: None significant today  COGNITION: Eval: Overall cognitive status: WFL for evaluation today   OBSERVATIONS:   Eval: Surgical site is clean and no overt signs of infection, no drainage, signs of dehiscence, etc.  Tenderness and swelling is within normal limits for post-op timeframe.     TODAY'S TREATMENT:  Post-evaluation treatment:   The patient was given safety information for managing post-op wound, including to start with scar mobilizations 3-4 times a day, avoid any strong gripping, push, pull, weight bearing or repetitive motion for the next 2 weeks, and to avoid heavy lifting or sports activities for 2 to 3 months. The patient was also educated (explanation and demonstration) on the following home exercise program including tolerable range of motion, gentle passive range of motion, scar care, progressive desensitization, prevention of soft tissue contractures, etc. The patient states understanding all directions and feels comfortable with doing this at home, self-management, and following up with the surgeon as needed/scheduled.   Exercises - Wrist Flexion Stretch  - 4 x daily - 3-5 reps - 15 sec hold - Wrist Prayer Stretch  - 4 x daily - 3-5 reps - 15 sec hold + Scar mobilizations  He was told that he does not need to wear the wrist cock up while at home at all, but may need it while at work for the next 1 to 2 weeks.  As soon as he feels like he does not need it, he does not need to wear it.   He should not be wearing it after 2 weeks from now.   PATIENT EDUCATION: Education details: See tx section above for details  Person educated: Patient Education method: Verbal Instruction, Teach back, Handouts  Education comprehension: States and demonstrates understanding   HOME EXERCISE PROGRAM: Access Code: [email or web address] URL: [email or web address] Date: 08/26/2024 Prepared by: Melvenia Ada   GOALS: Goals reviewed with patient? Yes   SHORT TERM GOALS: (STG required if POC>30 days) Target Date: 08/26/2024  1.  Pt will demo/state understanding of initial HEP and therapist recommendations to improve pain levels, improve motions and ability and eventually return to normal activities.   Goal status: MET     ASSESSMENT:  CLINICAL IMPRESSION: Patient is a 72y.o. male who was seen today for occupational therapy evaluation for swelling, pain, weakness and decreased functional ability following volar ganglion cyst removal procedure. The patient  is appropriate for OT rehab services and benefited from treatment today. The patient got copious education/treatment today for self-care, wound management, exercises and how to transition to normal activities in the next 2-4 weeks. The patient agrees that they can manage these recommendations independently, and should not need to return for follow up visits. The patient should follow up with the surgeon with any concerns, and could possibly return to therapy, if needed, with a new order.  The patient will discharge therapy treatment after this visit.     PERFORMANCE DEFICITS: in functional skills including ADLs, IADLs, coordination, dexterity, sensation, edema, ROM, strength, pain, fascial restrictions, flexibility, Fine motor control, body mechanics, endurance, decreased knowledge of precautions, wound, and UE functional use, cognitive skills including problem solving and safety awareness, and psychosocial skills including coping  strategies, environmental adaptation, and habits.   IMPAIRMENTS: are limiting patient from ADLs, IADLs, rest and sleep, leisure, and social participation.   COMORBIDITIES: may have co-morbidities  that affects occupational performance. Patient will benefit from skilled OT to address above impairments and improve overall function.  MODIFICATION OR ASSISTANCE TO COMPLETE EVALUATION: No modification of tasks or assist necessary to complete an evaluation.  OT OCCUPATIONAL PROFILE AND HISTORY: Problem focused assessment: Including review of records relating to presenting problem.  CLINICAL DECISION MAKING: LOW - limited treatment options, no task modification necessary  REHAB POTENTIAL: Excellent  EVALUATION COMPLEXITY: Low      PLAN:  OT FREQUENCY: one time visit  OT DURATION: 1 sessions  PLANNED INTERVENTIONS: self care/ADL training, therapeutic exercise, therapeutic activity, neuromuscular re-education, manual therapy, scar mobilization, passive range of motion, splinting, ultrasound, fluidotherapy, compression bandaging, moist heat, cryotherapy, contrast bath, patient/family education, energy conservation, coping strategies training, and Re-evaluation  RECOMMENDED OTHER SERVICES: none now   CONSULTED AND AGREED WITH PLAN OF CARE: Patient  PLAN FOR NEXT SESSION:   N/A    Melvenia Ada, OTR/L, CHT 08/26/2024, 1:30 PM

## 2024-08-26 ENCOUNTER — Encounter: Payer: Self-pay | Admitting: Rehabilitative and Restorative Service Providers"

## 2024-08-26 ENCOUNTER — Ambulatory Visit: Admitting: Rehabilitative and Restorative Service Providers"

## 2024-08-26 DIAGNOSIS — M6281 Muscle weakness (generalized): Secondary | ICD-10-CM | POA: Diagnosis not present

## 2024-08-26 DIAGNOSIS — M25632 Stiffness of left wrist, not elsewhere classified: Secondary | ICD-10-CM | POA: Diagnosis not present

## 2024-08-27 ENCOUNTER — Encounter: Admitting: Orthopedic Surgery

## 2024-08-31 ENCOUNTER — Encounter: Payer: Self-pay | Admitting: Radiology

## 2024-08-31 ENCOUNTER — Ambulatory Visit: Admitting: Orthopedic Surgery

## 2024-08-31 DIAGNOSIS — B351 Tinea unguium: Secondary | ICD-10-CM

## 2024-09-01 ENCOUNTER — Encounter: Payer: Self-pay | Admitting: Orthopedic Surgery

## 2024-09-01 NOTE — Progress Notes (Signed)
 Office Visit Note   Patient: Michael Moore           Date of Birth: November 05, 1950           MRN: [id number] Visit Date: 08/31/2024              Requested by: Ransom Other, MD [address] [place],  [place] [address] PCP: Ransom Other, MD  Chief Complaint  Patient presents with   Right Foot - Follow-up   Left Foot - Follow-up      HPI: Discussed the use of AI scribe software for clinical note transcription with the patient, who gave verbal consent to proceed.  History of Present Illness Michael Moore is a 73 year old male who presents for toenail care due to onychomycosis.  He experiences difficulty trimming his toenails safely, necessitating professional care. Occasionally, the skin grows into the fungus, causing minor bleeding.  He has been soaking his feet and using a solution in the water to manage the condition. Regular trimming is required to prevent discomfort as the toenails grow into the skin.  He recently underwent wrist surgery and is feeling good post-operatively.     Assessment & Plan: Visit Diagnoses:  1. Onychomycosis     Plan: Assessment and Plan Assessment & Plan Onychomycosis of toenails with difficulty in self-care Chronic onychomycosis causing thickened, discolored nails. No ulcers. Occasional minor bleeding from skin ingrowth. - Trimmed toenails without complications. - Apply Band-Aid to affected areas as needed. - Scheduled follow-up appointment in three months.      Follow-Up Instructions: Return in about 3 months (around 12/01/2024).   Ortho Exam  Patient is alert, oriented, no adenopathy, well-dressed, normal affect, normal respiratory effort. Physical Exam EXTREMITIES: No venous or plantar ulcers. Thickened discolored onychomycotic nails on all toes. Toenails trimmed without complications.      Imaging: No results found. No images are attached to the encounter.  Labs: No results found for: HGBA1C,  ESRSEDRATE, CRP, LABURIC, REPTSTATUS, GRAMSTAIN, CULT, LABORGA   Lab Results  Component Value Date   ALBUMIN 3.7 08/11/2014    No results found for: MG No results found for: VD25OH  No results found for: PREALBUMIN    Latest Ref Rng & Units 12/31/2023    2:15 PM 07/15/2023    2:41 PM 07/23/2020   10:10 PM  CBC EXTENDED  WBC 4.0 - 10.5 K/uL 5.7  5.1  7.3   RBC 4.22 - 5.81 MIL/uL 4.98  5.49  4.63   Hemoglobin 13.0 - 17.0 g/dL 84.6  83.2  85.4   HCT 39.0 - 52.0 % 45.8  50.9  43.1   Platelets 150 - 400 K/uL 178  187  183      There is no height or weight on file to calculate BMI.  Orders:  No orders of the defined types were placed in this encounter.  No orders of the defined types were placed in this encounter.    Procedures: No procedures performed  Clinical Data: No additional findings.  ROS:  All other systems negative, except as noted in the HPI. Review of Systems  Objective: Vital Signs: There were no vitals taken for this visit.  Specialty Comments:  No specialty comments available.  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   Hypertension    Umbilical hernia     Family History  Problem Relation Age of Onset   Heart attack Mother    Heart attack Maternal Grandmother    Heart attack Maternal Grandfather    Stroke Paternal Grandmother    Stroke Paternal Grandfather    Heart attack Brother    Heart  attack Sister     Past Surgical History:  Procedure Laterality Date   AORTIC ROOT REPLACEMENT  04/07/2007   aortic dissection repair and AVR   COLONOSCOPY     FOOT SURGERY Left 09/03/2005   GANGLION CYST EXCISION Left 08/14/2024   Procedure: EXCISION, GANGLION CYST, WRIST;  Surgeon: Arlinda Buster, MD;  Location: [facility];  Service: Orthopedics;  Laterality: Left;   HERNIA REPAIR     left in 03/1984; right in 03/1977   TOTAL HIP ARTHROPLASTY Right 08/25/2014   Procedure: TOTAL HIP ARTHROPLASTY;  Surgeon: Jerona Harden GAILS, MD;  Location: [facility] OR;  Service: Orthopedics;  Laterality: Right;  Right Total Hip Arthroplasty   VENTRAL HERNIA REPAIR N/A 01/08/2024   Procedure: LAPAROSCOPIC VENTRAL HERNIA REPAIR WITH MESH;  Surgeon: Rubin Calamity, MD;  Location: [facility] OR;  Service: General;  Laterality: N/A;   Social History   Occupational History   Not on file  Tobacco Use   Smoking status: Never   Smokeless tobacco: Never  Vaping Use   Vaping status: Never Used  Substance and Sexual Activity   Alcohol use: Yes    Alcohol/week: 3.0 standard drinks of alcohol    Types: 3 Glasses of wine per week    Comment: daily red wine   Drug use: No   Sexual activity: Not on file

## 2024-09-04 DIAGNOSIS — H2513 Age-related nuclear cataract, bilateral: Secondary | ICD-10-CM | POA: Diagnosis not present

## 2024-09-04 DIAGNOSIS — H31012 Macula scars of posterior pole (postinflammatory) (post-traumatic), left eye: Secondary | ICD-10-CM | POA: Diagnosis not present

## 2024-09-20 NOTE — Progress Notes (Unsigned)
   Michael Moore - 73 y.o. male MRN [id number]  Date of birth: 09-Aug-1951  Office Visit Note: Visit Date: 09/21/2024 PCP: Ransom Other, MD Referred by: Ransom Other, MD  Subjective:  HPI: Michael Moore is a 73 y.o. male who presents today for follow up 5 weeks status post left wrist volar ganglion cyst excision. Left wrist palmar cutaneous branch of median nerve neurolysis.  Pertinent ROS were reviewed with the patient and found to be negative unless otherwise specified above in HPI.   Assessment & Plan: Visit Diagnoses: No diagnosis found.  Plan: ***  Follow-up: No follow-ups on file.   Meds & Orders: No orders of the defined types were placed in this encounter.  No orders of the defined types were placed in this encounter.    Procedures: No procedures performed       Objective:   Vital Signs: There were no vitals taken for this visit.  Ortho Exam ***  Imaging: No results found.   Uri Covey Afton Alderton, M.D. [facility], Hand Surgery

## 2024-09-21 ENCOUNTER — Ambulatory Visit: Admitting: Orthopedic Surgery

## 2024-09-21 DIAGNOSIS — Z9889 Other specified postprocedural states: Secondary | ICD-10-CM

## 2024-10-01 NOTE — Progress Notes (Signed)
 Per Sari at Dr. Rubin office, hold Aspirin  5 days prior to surgery.  Last dose should be on Saturday, December 6th.    Message left with instructions for patient.

## 2024-10-01 NOTE — Progress Notes (Signed)
 Surgical Instructions   Your procedure is scheduled on Friday, December 12th, 2025. Report to [facility] Main Entrance A at 10:45 A.M., then check in with the Admitting office. Any questions or running late day of surgery: call [phone]  Questions prior to your surgery date: call [phone], Monday-Friday, 8am-4pm. If you experience any cold or flu symptoms such as cough, fever, chills, shortness of breath, etc. between now and your scheduled surgery, please notify us  at the above number.     Remember:  Do not eat after midnight the night before your surgery   You may drink clear liquids until 9:45 the morning of your surgery.   Clear liquids allowed are: Water, Non-Citrus Juices (without pulp), Carbonated Beverages, Clear Tea (no milk, honey, etc.), Black Coffee Only (NO MILK, CREAM OR POWDERED CREAMER of any kind), and Gatorade.    Take these medicines the morning of surgery with A SIP OF WATER: Metoprolol  Succinate (Toprol -XL)   May take these medicines IF NEEDED: None.   Per your surgeon's instructions, Aspirin  should be held for 5 days prior to your surgery.  Your last dose of Aspirin  should be on Saturday, December 6th.      One week prior to surgery, STOP taking any Aleve, Naproxen, Ibuprofen, Motrin, Advil, Goody's, BC's, all herbal medications, fish oil, and non-prescription vitamins.                     Do NOT Smoke (Tobacco/Vaping) for 24 hours prior to your procedure.  If you use a CPAP at night, you may bring your mask/headgear for your overnight stay.   You will be asked to remove any contacts, glasses, piercing's, hearing aid's, dentures/partials prior to surgery. Please bring cases for these items if needed.    Patients discharged the day of surgery will not be allowed to drive home, and someone needs to stay with them for 24 hours.  SURGICAL WAITING ROOM VISITATION Patients may have no more than 2 support people in the waiting area - these visitors  may rotate.   Pre-op nurse will coordinate an appropriate time for 2 ADULT support persons, who may not rotate, to accompany patient in pre-op.  Children under the age of 31 must have an adult with them who is not the patient and must remain in the main waiting area with an adult.  If the patient needs to stay at the hospital during part of their recovery, the visitor guidelines for inpatient rooms apply.  Please refer to the [facility] website for the visitor guidelines for any additional information.   If you received a COVID test during your pre-op visit  it is requested that you wear a mask when out in public, stay away from anyone that may not be feeling well and notify your surgeon if you develop symptoms. If you have been in contact with anyone that has tested positive in the last 10 days please notify you surgeon.      Pre-operative CHG Bathing Instructions   You can play a key role in reducing the risk of infection after surgery. Your skin needs to be as free of germs as possible. You can reduce the number of germs on your skin by washing with CHG (chlorhexidine  gluconate) soap before surgery. CHG is an antiseptic soap that kills germs and continues to kill germs even after washing.   DO NOT use if you have an allergy to chlorhexidine /CHG or antibacterial soaps. If your skin becomes reddened or irritated, stop  using the CHG and notify one of our RNs at [phone].              TAKE A SHOWER THE NIGHT BEFORE SURGERY   Please keep in mind the following:  DO NOT shave, including legs and underarms, 48 hours prior to surgery.   You may shave your face before/day of surgery.  Place clean sheets on your bed the night before surgery Use a clean washcloth (not used since being washed) for shower. DO NOT sleep with pet's night before surgery.  CHG Shower Instructions:  Wash your face and private area with normal soap. If you choose to wash your hair, wash first with your normal  shampoo.  After you use shampoo/soap, rinse your hair and body thoroughly to remove shampoo/soap residue.  Turn the water OFF and apply half the bottle of CHG soap to a CLEAN washcloth.  Apply CHG soap ONLY FROM YOUR NECK DOWN TO YOUR TOES (washing for 3-5 minutes)  DO NOT use CHG soap on face, private areas, open wounds, or sores.  Pay special attention to the area where your surgery is being performed.  If you are having back surgery, having someone wash your back for you may be helpful. Wait 2 minutes after CHG soap is applied, then you may rinse off the CHG soap.  Pat dry with a clean towel  Put on clean pajamas    Additional instructions for the day of surgery: If you choose, you may shower the morning of surgery with an antibacterial soap.  DO NOT APPLY any lotions, deodorants, cologne, or perfumes.   Do not wear jewelry or makeup Do not wear nail polish, gel polish, artificial nails, or any other type of covering on natural nails (fingers and toes) Do not bring valuables to the hospital. [facility] is not responsible for valuables/personal belongings. Put on clean/comfortable clothes.  Please brush your teeth.  Ask your nurse before applying any prescription medications to the skin.

## 2024-10-05 ENCOUNTER — Encounter (HOSPITAL_COMMUNITY): Payer: Self-pay

## 2024-10-05 ENCOUNTER — Inpatient Hospital Stay (HOSPITAL_COMMUNITY)
Admission: RE | Admit: 2024-10-05 | Discharge: 2024-10-05 | Disposition: A | Source: Ambulatory Visit | Attending: General Surgery

## 2024-10-05 ENCOUNTER — Other Ambulatory Visit: Payer: Self-pay

## 2024-10-05 VITALS — BP 145/68 | HR 51 | Temp 97.9°F | Resp 20 | Ht 72.0 in | Wt 264.4 lb

## 2024-10-05 DIAGNOSIS — I2510 Atherosclerotic heart disease of native coronary artery without angina pectoris: Secondary | ICD-10-CM | POA: Diagnosis not present

## 2024-10-05 DIAGNOSIS — Z01812 Encounter for preprocedural laboratory examination: Secondary | ICD-10-CM | POA: Diagnosis not present

## 2024-10-05 DIAGNOSIS — Z953 Presence of xenogenic heart valve: Secondary | ICD-10-CM | POA: Diagnosis not present

## 2024-10-05 DIAGNOSIS — I10 Essential (primary) hypertension: Secondary | ICD-10-CM | POA: Diagnosis not present

## 2024-10-05 DIAGNOSIS — Z01818 Encounter for other preprocedural examination: Secondary | ICD-10-CM

## 2024-10-05 DIAGNOSIS — R001 Bradycardia, unspecified: Secondary | ICD-10-CM | POA: Diagnosis not present

## 2024-10-05 DIAGNOSIS — I7100 Dissection of unspecified site of aorta: Secondary | ICD-10-CM | POA: Diagnosis not present

## 2024-10-05 LAB — CBC
HCT: 48.4 % (ref 39.0–52.0)
Hemoglobin: 16.1 g/dL (ref 13.0–17.0)
MCH: 31.3 pg (ref 26.0–34.0)
MCHC: 33.3 g/dL (ref 30.0–36.0)
MCV: 94 fL (ref 80.0–100.0)
Platelets: 169 K/uL (ref 150–400)
RBC: 5.15 MIL/uL (ref 4.22–5.81)
RDW: 12.7 % (ref 11.5–15.5)
WBC: 6.1 K/uL (ref 4.0–10.5)
nRBC: 0 % (ref 0.0–0.2)

## 2024-10-05 LAB — BASIC METABOLIC PANEL WITH GFR
Anion gap: 7 (ref 5–15)
BUN: 21 mg/dL (ref 8–23)
CO2: 30 mmol/L (ref 22–32)
Calcium: 9.1 mg/dL (ref 8.9–10.3)
Chloride: 103 mmol/L (ref 98–111)
Creatinine, Ser: 1.11 mg/dL (ref 0.61–1.24)
GFR, Estimated: >60 mL/min (ref >60)
Glucose, Bld: 104 mg/dL — ABNORMAL HIGH (ref 70–99)
Potassium: 3.8 mmol/L (ref 3.5–5.1)
Sodium: 140 mmol/L (ref 135–145)

## 2024-10-05 LAB — NO BLOOD PRODUCTS

## 2024-10-05 LAB — SURGICAL PCR SCREEN
MRSA, PCR: NEGATIVE
Staphylococcus aureus: NEGATIVE

## 2024-10-05 NOTE — Progress Notes (Signed)
 PCP - Dr. Ardell Manly Cardiologist - Dr. Helene Bunker at [facility] to see 10/06/24  PPM/ICD - n/a Device Orders -  Rep Notified -   Chest x-ray -  EKG - 08/07/24 Stress Test - reports he walked a treadmill about 5 years ago--told results were fine. Reports study was performed at [facility], but unable to see any results in [facility]. ECHO - scheduled 10/06/24 Cardiac Cath - denies  Sleep Study - denies CPAP -   Fasting Blood Sugar - n/a Checks Blood Sugar _____ times a day  Last dose of GLP1 agonist-  n/a GLP1 instructions:   Blood Thinner Instructions: Aspirin  Instructions: Hold ASA 5 days prior to surgery, LD 12/6  ERAS Protcol - clear liquids until 9:45 PRE-SURGERY Ensure or G2-   COVID TEST- n/a   Anesthesia review: yes, pending cardiac clearance  Patient denies shortness of breath, fever, cough and chest pain at PAT appointment   All instructions explained to the patient, with a verbal understanding of the material. Patient agrees to go over the instructions while at home for a better understanding.

## 2024-10-06 DIAGNOSIS — Z09 Encounter for follow-up examination after completed treatment for conditions other than malignant neoplasm: Secondary | ICD-10-CM | POA: Diagnosis not present

## 2024-10-06 DIAGNOSIS — I119 Hypertensive heart disease without heart failure: Secondary | ICD-10-CM | POA: Diagnosis not present

## 2024-10-06 DIAGNOSIS — Z952 Presence of prosthetic heart valve: Secondary | ICD-10-CM | POA: Diagnosis not present

## 2024-10-06 DIAGNOSIS — Z8679 Personal history of other diseases of the circulatory system: Secondary | ICD-10-CM | POA: Diagnosis not present

## 2024-10-06 DIAGNOSIS — Z953 Presence of xenogenic heart valve: Secondary | ICD-10-CM | POA: Diagnosis not present

## 2024-10-06 DIAGNOSIS — Z7982 Long term (current) use of aspirin: Secondary | ICD-10-CM | POA: Diagnosis not present

## 2024-10-06 DIAGNOSIS — Z79899 Other long term (current) drug therapy: Secondary | ICD-10-CM | POA: Diagnosis not present

## 2024-10-06 DIAGNOSIS — I517 Cardiomegaly: Secondary | ICD-10-CM | POA: Diagnosis not present

## 2024-10-06 DIAGNOSIS — I3139 Other pericardial effusion (noninflammatory): Secondary | ICD-10-CM | POA: Diagnosis not present

## 2024-10-06 DIAGNOSIS — I088 Other rheumatic multiple valve diseases: Secondary | ICD-10-CM | POA: Diagnosis not present

## 2024-10-06 NOTE — Progress Notes (Signed)
 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  Last seen 11/19/23, doing well at that time, no acute concerns. 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  No changes made to management, 1 year follow-up recommended.  Echo 10/07/23 showed EF 65%, bioprosthetic aortic valve mean gradient 7.9 mmHg, no significant valvular stenosis or regurgitation. Stable CT 11/2022.     Patient recently underwent laparoscopic ventral hernia repair with mesh on 01/08/2024 without complication and left wrist ganglion cyst excision on 08/14/2024 without complication.  Per anesthesia intubation note 01/08/2024, GlideScope was used electively.  Preop labs reviewed, WNL.   EKG 08/07/2024: Sinus bradycardia.  Rate 59.   TTE 10/07/2023 (Care Everywhere): SUMMARY  The left ventricular size is normal.  Mild left ventricular hypertrophy.  LV ejection fraction = 60-65%.  Left ventricular systolic function is normal.  The right ventricle is normal in size and function.  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  vmax 2.1 m-s, mean gradient 7.9 mm Hg  The IVC is normal in size with an inspiratory collapse of greater than 50%, suggesting normal right  atrial pressure.  There is no pericardial effusion.  There is no significant change in comparison with the last study.    CT chest/abdomen 12/26/22 (care everywhere): CONCLUSION:  1. 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  2.  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  3.  Small ventral abdominal wall hernia containing omentum and transverse colon.     Lynwood Geofm [facility] Short Stay  Center/Anesthesiology Phone [phone] 10/06/2024 3:45 PM

## 2024-10-06 NOTE — Anesthesia Preprocedure Evaluation (Signed)
 Anesthesia Evaluation    Airway        Dental   Pulmonary           Cardiovascular hypertension,      Neuro/Psych    GI/Hepatic   Endo/Other    Renal/GU      Musculoskeletal   Abdominal   Peds  Hematology   Anesthesia Other Findings   Reproductive/Obstetrics                              Anesthesia Physical Anesthesia Plan  ASA:   Anesthesia Plan:    Post-op Pain Management:    Induction:   PONV Risk Score and Plan:   Airway Management Planned:   Additional Equipment:   Intra-op Plan:   Post-operative Plan:   Informed Consent:   Plan Discussed with:   Anesthesia Plan Comments: (PAT note by Lynwood Hope, PA-C: 73 year old male follows annually with cardiology at [facility] for 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  Last seen 11/19/23, doing well at that time, no acute concerns. 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  No changes made to management, 1 year follow-up recommended.  Echo 10/07/23 showed EF 65%, bioprosthetic aortic valve mean gradient 7.9 mmHg, no significant valvular stenosis or regurgitation. Stable CT 11/2022.     Patient recently underwent laparoscopic ventral hernia repair with mesh on 01/08/2024 without complication and left wrist ganglion cyst excision on 08/14/2024 without complication.  Per anesthesia intubation note 01/08/2024, GlideScope was used electively.  Preop labs reviewed, WNL.   EKG 08/07/2024: Sinus bradycardia.  Rate 59.   TTE 10/07/2023 (Care Everywhere): SUMMARY  The left ventricular size is normal.  Mild left ventricular hypertrophy.  LV ejection fraction = 60-65%.  Left ventricular systolic function is normal.  The right ventricle is normal in size and function.  status post 27 mm Medtronic Freestyle Stentless Porcine Bioprosthesis with no paravalvular   regurgitation.  vmax 2.1 m-s, mean gradient 7.9 mm Hg  The IVC is normal in size with an inspiratory collapse of greater than 50%, suggesting normal right  atrial pressure.  There is no pericardial effusion.  There is no significant change in comparison with the last study.    CT chest/abdomen 12/26/22 (care everywhere): CONCLUSION:  1. 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  2.  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  3.  Small ventral abdominal wall hernia containing omentum and transverse colon.   )         Anesthesia Quick Evaluation

## 2024-10-08 NOTE — Progress Notes (Signed)
 Left a message to pt's voicemail to arrive tom at 0915.

## 2024-10-09 ENCOUNTER — Ambulatory Visit (HOSPITAL_COMMUNITY)
Admission: RE | Admit: 2024-10-09 | Discharge: 2024-10-09 | Disposition: A | Attending: General Surgery | Admitting: General Surgery

## 2024-10-09 ENCOUNTER — Encounter (HOSPITAL_COMMUNITY): Payer: Self-pay | Admitting: General Surgery

## 2024-10-09 ENCOUNTER — Encounter: Admission: RE | Disposition: A | Payer: Self-pay | Attending: General Surgery

## 2024-10-09 ENCOUNTER — Other Ambulatory Visit: Payer: Self-pay

## 2024-10-09 ENCOUNTER — Ambulatory Visit (HOSPITAL_COMMUNITY): Payer: Self-pay | Admitting: Physician Assistant

## 2024-10-09 ENCOUNTER — Ambulatory Visit (HOSPITAL_BASED_OUTPATIENT_CLINIC_OR_DEPARTMENT_OTHER): Admitting: Certified Registered Nurse Anesthetist

## 2024-10-09 DIAGNOSIS — I10 Essential (primary) hypertension: Secondary | ICD-10-CM

## 2024-10-09 DIAGNOSIS — D170 Benign lipomatous neoplasm of skin and subcutaneous tissue of head, face and neck: Secondary | ICD-10-CM | POA: Diagnosis not present

## 2024-10-09 DIAGNOSIS — Z79899 Other long term (current) drug therapy: Secondary | ICD-10-CM | POA: Diagnosis not present

## 2024-10-09 DIAGNOSIS — D171 Benign lipomatous neoplasm of skin and subcutaneous tissue of trunk: Secondary | ICD-10-CM | POA: Diagnosis not present

## 2024-10-09 DIAGNOSIS — M1990 Unspecified osteoarthritis, unspecified site: Secondary | ICD-10-CM | POA: Diagnosis not present

## 2024-10-09 HISTORY — PX: EXCISION MASS NECK: SHX6703

## 2024-10-09 SURGERY — EXCISION, MASS, NECK
Anesthesia: General | Site: Neck

## 2024-10-09 MED ORDER — SUGAMMADEX SODIUM 200 MG/2ML IV SOLN
INTRAVENOUS | Status: DC | PRN
  Administered 2024-10-09: 400 mg via INTRAVENOUS

## 2024-10-09 MED ORDER — BUPIVACAINE-EPINEPHRINE (PF) 0.25% -1:200000 IJ SOLN
INTRAMUSCULAR | Status: AC
  Filled 2024-10-09: qty 30

## 2024-10-09 MED ORDER — OXYCODONE HCL 5 MG/5ML PO SOLN: 5.0000 mg | Freq: Once | ORAL | Status: DC | PRN

## 2024-10-09 MED ORDER — ACETAMINOPHEN 500 MG PO TABS
1000.0000 mg | ORAL_TABLET | Freq: Once | ORAL | Status: AC
  Administered 2024-10-09: 1000 mg via ORAL
  Filled 2024-10-09: qty 2

## 2024-10-09 MED ORDER — BUPIVACAINE-EPINEPHRINE 0.25% -1:200000 IJ SOLN
INTRAMUSCULAR | Status: DC | PRN
  Administered 2024-10-09: 5 mL

## 2024-10-09 MED ORDER — TRAMADOL HCL 50 MG PO TABS: 50.0000 mg | ORAL_TABLET | Freq: Four times a day (QID) | ORAL | 0 refills | Status: AC | PRN

## 2024-10-09 MED ORDER — FENTANYL CITRATE (PF) 100 MCG/2ML IJ SOLN: 25.0000 ug | INTRAMUSCULAR | Status: DC | PRN

## 2024-10-09 MED ORDER — DROPERIDOL 2.5 MG/ML IJ SOLN: 0.6250 mg | Freq: Once | INTRAMUSCULAR | Status: DC | PRN

## 2024-10-09 MED ORDER — EPHEDRINE SULFATE-NACL 50-0.9 MG/10ML-% IV SOSY
PREFILLED_SYRINGE | INTRAVENOUS | Status: DC | PRN
  Administered 2024-10-09 (×3): 10 mg via INTRAVENOUS

## 2024-10-09 MED ORDER — ROCURONIUM BROMIDE 10 MG/ML (PF) SYRINGE
PREFILLED_SYRINGE | INTRAVENOUS | Status: DC | PRN
  Administered 2024-10-09: 60 mg via INTRAVENOUS

## 2024-10-09 MED ORDER — CHLORHEXIDINE GLUCONATE 0.12 % MT SOLN
15.0000 mL | Freq: Once | OROMUCOSAL | Status: AC
  Administered 2024-10-09: 15 mL via OROMUCOSAL
  Filled 2024-10-09: qty 15

## 2024-10-09 MED ORDER — ORAL CARE MOUTH RINSE: 15.0000 mL | Freq: Once | OROMUCOSAL | Status: AC

## 2024-10-09 MED ORDER — LIDOCAINE 2% (20 MG/ML) 5 ML SYRINGE
INTRAMUSCULAR | Status: DC | PRN
  Administered 2024-10-09: 100 mg via INTRAVENOUS

## 2024-10-09 MED ORDER — OXYCODONE HCL 5 MG PO TABS: 5.0000 mg | ORAL_TABLET | Freq: Once | ORAL | Status: DC | PRN

## 2024-10-09 MED ORDER — CEFAZOLIN SODIUM-DEXTROSE 2-3 GM-%(50ML) IV SOLR
INTRAVENOUS | Status: DC | PRN
  Administered 2024-10-09: 2 g via INTRAVENOUS

## 2024-10-09 MED ORDER — LACTATED RINGERS IV SOLN: INTRAVENOUS | Status: DC

## 2024-10-09 MED ORDER — PROPOFOL 10 MG/ML IV BOLUS
INTRAVENOUS | Status: AC
  Filled 2024-10-09: qty 20

## 2024-10-09 MED ORDER — FENTANYL CITRATE (PF) 250 MCG/5ML IJ SOLN
INTRAMUSCULAR | Status: DC | PRN
  Administered 2024-10-09: 100 ug via INTRAVENOUS

## 2024-10-09 MED ORDER — ONDANSETRON HCL 4 MG/2ML IJ SOLN
INTRAMUSCULAR | Status: DC | PRN
  Administered 2024-10-09: 4 mg via INTRAVENOUS

## 2024-10-09 MED ORDER — DEXAMETHASONE SOD PHOSPHATE PF 10 MG/ML IJ SOLN
INTRAMUSCULAR | Status: DC | PRN
  Administered 2024-10-09: 10 mg via INTRAVENOUS

## 2024-10-09 MED ORDER — FENTANYL CITRATE (PF) 250 MCG/5ML IJ SOLN
INTRAMUSCULAR | Status: AC
  Filled 2024-10-09: qty 5

## 2024-10-09 MED ORDER — PROPOFOL 10 MG/ML IV BOLUS
INTRAVENOUS | Status: DC | PRN
  Administered 2024-10-09: 50 mg via INTRAVENOUS
  Administered 2024-10-09: 150 mg via INTRAVENOUS

## 2024-10-09 MED ADMIN — Sodium Chloride Irrigation Soln 0.9%: 1000 mL | NDC 99999050048

## 2024-10-09 MED FILL — Ondansetron HCl Inj 4 MG/2ML (2 MG/ML): INTRAMUSCULAR | Qty: 2 | Status: AC

## 2024-10-09 SURGICAL SUPPLY — 22 items
BLADE CLIPPER SURG (BLADE) IMPLANT
CANISTER SUCTION 3000ML PPV (SUCTIONS) IMPLANT
CHLORAPREP W/TINT 26 (MISCELLANEOUS) ×1 IMPLANT
COVER SURGICAL LIGHT HANDLE (MISCELLANEOUS) ×1 IMPLANT
DERMABOND ADVANCED .7 DNX12 (GAUZE/BANDAGES/DRESSINGS) IMPLANT
DRAPE LAPAROTOMY 100X72 PEDS (DRAPES) IMPLANT
ELECTRODE REM PT RTRN 9FT ADLT (ELECTROSURGICAL) ×1 IMPLANT
GLOVE BIO SURGEON STRL SZ7.5 (GLOVE) ×2 IMPLANT
GLOVE BIOGEL PI IND STRL 8 (GLOVE) ×1 IMPLANT
GOWN STRL REUS W/ TWL LRG LVL3 (GOWN DISPOSABLE) ×1 IMPLANT
GOWN STRL REUS W/ TWL XL LVL3 (GOWN DISPOSABLE) ×1 IMPLANT
KIT BASIN OR (CUSTOM PROCEDURE TRAY) ×1 IMPLANT
KIT TURNOVER KIT B (KITS) ×1 IMPLANT
NDL HYPO 25GX1X1/2 BEV (NEEDLE) ×1 IMPLANT
NEEDLE HYPO 25GX1X1/2 BEV (NEEDLE) ×1 IMPLANT
PACK GENERAL/GYN (CUSTOM PROCEDURE TRAY) ×1 IMPLANT
PAD ARMBOARD POSITIONER FOAM (MISCELLANEOUS) ×1 IMPLANT
SOLN 0.9% NACL POUR BTL 1000ML (IV SOLUTION) ×1 IMPLANT
SUT MNCRL AB 4-0 PS2 18 (SUTURE) ×1 IMPLANT
SUT VIC AB 3-0 SH 18 (SUTURE) ×1 IMPLANT
SYR CONTROL 10ML LL (SYRINGE) ×1 IMPLANT
TOWEL GREEN STERILE (TOWEL DISPOSABLE) ×1 IMPLANT

## 2024-10-09 NOTE — Op Note (Signed)
 10/09/2024  1:04 PM  PATIENT:  Michael Moore  73 y.o. male  PRE-OPERATIVE DIAGNOSIS:  LIPOMA OF BACK  POST-OPERATIVE DIAGNOSIS:  LIPOMA OF BACK 6x4x4cm  PROCEDURE:  Procedures with comments: EXCISION, MASS, NECK (N/A) - EXCISION POSTERIOR NECK  SURGEON:  Surgeons and Role:    * Rubin Calamity, MD - Primary  ASSISTANTS: none   ANESTHESIA:   local and general  EBL:  minimal   BLOOD ADMINISTERED:none  DRAINS: none   LOCAL MEDICATIONS USED:  BUPIVICAINE   SPECIMEN:  Source of Specimen:  back lipoma  DISPOSITION OF SPECIMEN:  PATHOLOGY  COUNTS:  YES  TOURNIQUET:  * No tourniquets in log *  DICTATION: .Dragon Dictation  Findings: Patient with a 6 x 4 x 4 cm back lipoma.  Indication procedure: Patient is a 73 year old male who comes in secondary to upper back lipoma that been there for multiple years.  Decided at this like to remove.  Details of procedure: After the patient was consented he was taken back to the OR and placed in supine position with bilateral SCDs in place.  He underwent general endotracheal anesthesia.  He was then placed in the prone position.  He was prepped and draped in standard fashion.  A timeout was called and all facts were verified.  At this time a midline incision was made over the area of the mass.  Dissection was taken down to the mass using cautery to maintain hemostasis.  This was circumferentially dissected away from the surrounding subcutaneous tissue.  This was excised in its entirety.  This measured 6 x 4 x 4 cm.  At this time 3-0 Vicryl stitches were used to reapproximate the subcutaneous tissue in a interrupted fashion.  The skin was then closed using 4-0 Monocryl in subcuticular fashion.  The area was injected with quarter percent Marcaine  with epi.  The skin was dressed with Dermabond.  Patient tolerated procedure well was taken to the recovery room in stable condition.    PLAN OF CARE: Discharge to home after PACU  PATIENT  DISPOSITION:  PACU - hemodynamically stable.   Delay start of Pharmacological VTE agent (>24hrs) due to surgical blood loss or risk of bleeding: not applicable

## 2024-10-09 NOTE — Transfer of Care (Signed)
 Immediate Anesthesia Transfer of Care Note  Patient: Michael Moore  Procedure(s) Performed: EXCISION, MASS, NECK (Neck)  Patient Location: PACU  Anesthesia Type:General  Level of Consciousness: awake, alert , and oriented  Airway & Oxygen Therapy: Patient Spontanous Breathing and Patient connected to face mask oxygen  Post-op Assessment: Report given to RN and Post -op Vital signs reviewed and stable  Post vital signs: Reviewed and stable  Last Vitals:  Vitals Value Taken Time  BP    Temp    Pulse 54 10/09/24 13:20  Resp 12 10/09/24 13:20  SpO2 96 % 10/09/24 13:20  Vitals shown include unfiled device data.  Last Pain:  Vitals:   10/09/24 0925  TempSrc:   PainSc: 0-No pain      Patients Stated Pain Goal: 0 (10/09/24 0907)  Complications: No notable events documented.

## 2024-10-09 NOTE — H&P (Signed)
 Subjective   Chief Complaint: Lipoma (Pt states that is has been there for years and he would like to get rid of it. )       History of Present Illness: Michael Moore is a 73 y.o. male who is seen today as an office consultation at the request of Dr. Husain for evaluation of Lipoma (Pt states that is has been there for years and he would like to get rid of it. ) .   History of Present Illness Michael Moore is a 73 year old male who presents for evaluation of a lipoma on his back. He was referred by Dr. Hussain for surgical evaluation of the lipoma.   The lipoma on his back has been present for a long time without infection, pain, or other symptoms. He is interested in surgical removal of the lipoma. He plans to have the surgery after his cardiology appointment in December.         Review of Systems: A complete review of systems was obtained from the patient.  I have reviewed this information and discussed as appropriate with the patient.  See HPI as well for other ROS.   Review of Systems  Constitutional:  Negative for fever.  HENT:  Negative for congestion.   Eyes:  Negative for blurred vision.  Respiratory:  Negative for cough, shortness of breath and wheezing.   Cardiovascular:  Negative for chest pain and palpitations.  Gastrointestinal:  Negative for heartburn.  Genitourinary:  Negative for dysuria.  Musculoskeletal:  Negative for myalgias.  Skin:  Negative for rash.  Neurological:  Negative for dizziness and headaches.  Psychiatric/Behavioral:  Negative for depression and suicidal ideas.   All other systems reviewed and are negative.       Medical History: Past Medical History Past Medical History: Diagnosis Date  Aneurysm ()    Heart valve disease    Hypertension         Problem List There is no problem list on file for this patient.     Past Surgical History Past Surgical History: Procedure Laterality Date  REPLACEMENT AORTIC VALVE   2008  Foot  surgery  Left    HERNIA REPAIR      JOINT REPLACEMENT       Hip      Allergies Allergies Allergen Reactions  Ace Inhibitors Cough  Azithromycin Other (See Comments)      Medications Ordered Prior to Encounter Current Outpatient Medications on File Prior to Visit Medication Sig Dispense Refill  aspirin  81 MG EC tablet 1 tablet Orally Once a day for 30 day(s)      hydroCHLOROthiazide  (HYDRODIURIL ) 25 MG tablet Take by mouth      losartan  (COZAAR ) 100 MG tablet Take 100 mg by mouth once daily      tadalafiL (CIALIS) 20 MG tablet Take 20 mg by mouth once daily as needed      metoprolol  succinate (TOPROL -XL) 100 MG XL tablet Take 100 mg by mouth once daily        No current facility-administered medications on file prior to visit.      Family History History reviewed. No pertinent family history.     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 : Never true Transportation Needs: No Transportation Needs (10/07/2023)   Received from [facility]    In the past 12 months, has lack of reliable transportation kept you from medical appointments, meetings, work or from getting things needed for daily living? : No Housing Stability: Unknown (11/19/2023)   Housing Stability Vital Sign    Homeless in the Last Year: No      Objective:      Vitals:   06/11/24 1355 PainSc: 0-No pain   There is no height or weight on file to calculate BMI.   Physical Exam Constitutional:      General: He is not in acute distress.    Appearance: Normal appearance.  HENT:     Head: Normocephalic.     Nose: No rhinorrhea.     Mouth/Throat:     Mouth: Mucous membranes are moist.     Pharynx: Oropharynx is clear.  Eyes:     General: No scleral icterus.    Pupils: Pupils are equal, round, and reactive to light.  Cardiovascular:     Rate and Rhythm: Normal rate.     Pulses: Normal pulses.  Pulmonary:     Effort: Pulmonary effort is normal. No respiratory distress.     Breath sounds: No stridor. No wheezing.  Abdominal:     General: Abdomen is flat. There is no distension.     Tenderness: There is no abdominal tenderness. There is no guarding or rebound.  Musculoskeletal:        General: Normal range of motion.     Cervical back: Normal range of motion and neck supple.  Skin:    General: Skin is warm and dry.     Capillary Refill: Capillary refill takes less than 2 seconds.     Coloration: Skin is not jaundiced.         Comments: 8x6cm SQ mass  Neurological:     General: No focal deficit present.     Mental Status: He is alert and oriented to person, place, and time. Mental status is at baseline.  Psychiatric:        Mood and Affect: Mood normal.        Thought Content: Thought content normal.        Judgment: Judgment normal.         Assessment and Plan: Diagnoses and all orders for this visit:   Lipoma of back     Michael Moore is a 73 y.o. male    Assessment & Plan Lipoma of back Lipoma located on the back, coinciding with the natural curvature of the spine, contributing to bulk. Lipoma is the primary consideration. - Schedule surgery for excision of lipoma after December 7th cardiology appointment and echocardiogram. - Perform outpatient excision with anesthesia, covering incision with waterproof dressing. -  Discuss potential complications: infection, bleeding, bruising, recurrence.                 No follow-ups on file.   Lynda Leos, MD, [facility], [place] [facility]

## 2024-10-09 NOTE — Anesthesia Postprocedure Evaluation (Signed)
 Anesthesia Post Note  Patient: Michael Moore  Procedure(s) Performed: EXCISION, MASS, NECK (Neck)     Patient location during evaluation: PACU Anesthesia Type: General Level of consciousness: awake and alert Pain management: pain level controlled Vital Signs Assessment: post-procedure vital signs reviewed and stable Respiratory status: spontaneous breathing, nonlabored ventilation and respiratory function stable Cardiovascular status: blood pressure returned to baseline Postop Assessment: no apparent nausea or vomiting Anesthetic complications: no   No notable events documented.  Last Vitals:  Vitals:   10/09/24 1315 10/09/24 1345  BP: (!) 140/69 (!) 141/73  Pulse:    Resp: 15   Temp: (!) 35.9 C 36.4 C  SpO2:      Last Pain:  Vitals:   10/09/24 1345  TempSrc:   PainSc: 0-No pain                 Vertell Row

## 2024-10-09 NOTE — Anesthesia Procedure Notes (Signed)
 Procedure Name: Intubation Date/Time: 10/09/2024 12:34 PM  Performed by: Mannie Krystal LABOR, CRNAPre-anesthesia Checklist: Patient identified, Emergency Drugs available, Suction available and Patient being monitored Patient Re-evaluated:Patient Re-evaluated prior to induction Oxygen Delivery Method: Circle system utilized Preoxygenation: Pre-oxygenation with 100% oxygen Induction Type: IV induction Ventilation: Mask ventilation without difficulty and Oral airway inserted - appropriate to patient size Laryngoscope Size: Mac and 4 Grade View: Grade II Tube type: Oral Tube size: 7.5 mm Number of attempts: 1 Airway Equipment and Method: Stylet and Oral airway Placement Confirmation: ETT inserted through vocal cords under direct vision, positive ETCO2 and breath sounds checked- equal and bilateral Secured at: 23 cm Tube secured with: Tape Dental Injury: Teeth and Oropharynx as per pre-operative assessment

## 2024-10-10 ENCOUNTER — Encounter (HOSPITAL_COMMUNITY): Payer: Self-pay | Admitting: General Surgery

## 2024-10-12 LAB — SURGICAL PATHOLOGY

## 2024-11-17 ENCOUNTER — Telehealth: Payer: Self-pay | Admitting: Orthopedic Surgery

## 2024-11-17 NOTE — Telephone Encounter (Signed)
 CALLED PT AND LEFT VM PROVIDER OUT OF OFFICE

## 2024-11-24 ENCOUNTER — Ambulatory Visit: Admitting: Orthopedic Surgery

## 2024-11-30 ENCOUNTER — Ambulatory Visit: Admitting: Orthopedic Surgery

## 2024-12-03 ENCOUNTER — Ambulatory Visit: Admitting: Orthopedic Surgery

## 2024-12-10 ENCOUNTER — Ambulatory Visit: Admitting: Orthopedic Surgery
